# Patient Record
Sex: Female | Born: 1975
Health system: Southern US, Community
[De-identification: ages and names within clinical notes are randomized; demographics above are authoritative.]

## PROBLEM LIST (undated history)

## (undated) DIAGNOSIS — F419 Anxiety disorder, unspecified: Secondary | ICD-10-CM

## (undated) DIAGNOSIS — I341 Nonrheumatic mitral (valve) prolapse: Secondary | ICD-10-CM

## (undated) DIAGNOSIS — N840 Polyp of corpus uteri: Secondary | ICD-10-CM

## (undated) DIAGNOSIS — F411 Generalized anxiety disorder: Secondary | ICD-10-CM

## (undated) DIAGNOSIS — F410 Panic disorder [episodic paroxysmal anxiety] without agoraphobia: Secondary | ICD-10-CM

## (undated) DIAGNOSIS — Z8659 Personal history of other mental and behavioral disorders: Secondary | ICD-10-CM

## (undated) DIAGNOSIS — D509 Iron deficiency anemia, unspecified: Secondary | ICD-10-CM

## (undated) DIAGNOSIS — D573 Sickle-cell trait: Secondary | ICD-10-CM

## (undated) DIAGNOSIS — N920 Excessive and frequent menstruation with regular cycle: Secondary | ICD-10-CM

## (undated) DIAGNOSIS — Z973 Presence of spectacles and contact lenses: Secondary | ICD-10-CM

## (undated) HISTORY — PX: TUBAL LIGATION: SHX77

---

## 1998-02-21 ENCOUNTER — Ambulatory Visit (HOSPITAL_COMMUNITY): Admission: RE | Admit: 1998-02-21 | Discharge: 1998-02-21 | Payer: Self-pay | Admitting: *Deleted

## 1998-02-21 ENCOUNTER — Inpatient Hospital Stay (HOSPITAL_COMMUNITY): Admission: AD | Admit: 1998-02-21 | Discharge: 1998-02-21 | Payer: Self-pay | Admitting: *Deleted

## 1998-03-31 ENCOUNTER — Inpatient Hospital Stay (HOSPITAL_COMMUNITY): Admission: AD | Admit: 1998-03-31 | Discharge: 1998-03-31 | Payer: Self-pay | Admitting: Obstetrics

## 1998-04-02 ENCOUNTER — Other Ambulatory Visit: Admission: RE | Admit: 1998-04-02 | Discharge: 1998-04-02 | Payer: Self-pay | Admitting: Obstetrics

## 1998-04-23 ENCOUNTER — Inpatient Hospital Stay (HOSPITAL_COMMUNITY): Admission: AD | Admit: 1998-04-23 | Discharge: 1998-04-23 | Payer: Self-pay | Admitting: *Deleted

## 1998-05-14 ENCOUNTER — Ambulatory Visit (HOSPITAL_COMMUNITY): Admission: RE | Admit: 1998-05-14 | Discharge: 1998-05-14 | Payer: Self-pay | Admitting: Obstetrics

## 1998-06-24 ENCOUNTER — Observation Stay (HOSPITAL_COMMUNITY): Admission: AD | Admit: 1998-06-24 | Discharge: 1998-06-24 | Payer: Self-pay | Admitting: Obstetrics

## 1998-06-28 ENCOUNTER — Inpatient Hospital Stay (HOSPITAL_COMMUNITY): Admission: AD | Admit: 1998-06-28 | Discharge: 1998-07-01 | Payer: Self-pay | Admitting: Obstetrics

## 1998-07-26 ENCOUNTER — Emergency Department (HOSPITAL_COMMUNITY): Admission: EM | Admit: 1998-07-26 | Discharge: 1998-07-27 | Payer: Self-pay | Admitting: Emergency Medicine

## 1998-09-22 ENCOUNTER — Emergency Department (HOSPITAL_COMMUNITY): Admission: EM | Admit: 1998-09-22 | Discharge: 1998-09-22 | Payer: Self-pay

## 1998-10-16 ENCOUNTER — Emergency Department (HOSPITAL_COMMUNITY): Admission: EM | Admit: 1998-10-16 | Discharge: 1998-10-16 | Payer: Self-pay | Admitting: Emergency Medicine

## 1998-11-15 HISTORY — PX: TUBAL LIGATION: SHX77

## 1999-04-11 ENCOUNTER — Inpatient Hospital Stay (HOSPITAL_COMMUNITY): Admission: AD | Admit: 1999-04-11 | Discharge: 1999-04-11 | Payer: Self-pay | Admitting: Obstetrics

## 1999-04-28 ENCOUNTER — Inpatient Hospital Stay (HOSPITAL_COMMUNITY): Admission: AD | Admit: 1999-04-28 | Discharge: 1999-04-28 | Payer: Self-pay | Admitting: Obstetrics

## 1999-05-25 ENCOUNTER — Inpatient Hospital Stay (HOSPITAL_COMMUNITY): Admission: AD | Admit: 1999-05-25 | Discharge: 1999-05-25 | Payer: Self-pay | Admitting: Obstetrics

## 1999-07-06 ENCOUNTER — Other Ambulatory Visit: Admission: RE | Admit: 1999-07-06 | Discharge: 1999-07-06 | Payer: Self-pay | Admitting: Obstetrics

## 1999-10-23 ENCOUNTER — Inpatient Hospital Stay (HOSPITAL_COMMUNITY): Admission: AD | Admit: 1999-10-23 | Discharge: 1999-10-25 | Payer: Self-pay | Admitting: Obstetrics

## 1999-10-23 ENCOUNTER — Encounter (INDEPENDENT_AMBULATORY_CARE_PROVIDER_SITE_OTHER): Payer: Self-pay | Admitting: Specialist

## 2000-07-17 ENCOUNTER — Emergency Department (HOSPITAL_COMMUNITY): Admission: EM | Admit: 2000-07-17 | Discharge: 2000-07-17 | Payer: Self-pay | Admitting: Emergency Medicine

## 2000-07-17 ENCOUNTER — Encounter: Payer: Self-pay | Admitting: Emergency Medicine

## 2000-12-03 ENCOUNTER — Emergency Department (HOSPITAL_COMMUNITY): Admission: EM | Admit: 2000-12-03 | Discharge: 2000-12-03 | Payer: Self-pay | Admitting: Emergency Medicine

## 2001-10-17 ENCOUNTER — Emergency Department (HOSPITAL_COMMUNITY): Admission: EM | Admit: 2001-10-17 | Discharge: 2001-10-17 | Payer: Self-pay | Admitting: Emergency Medicine

## 2002-03-18 ENCOUNTER — Emergency Department (HOSPITAL_COMMUNITY): Admission: EM | Admit: 2002-03-18 | Discharge: 2002-03-18 | Payer: Self-pay | Admitting: Emergency Medicine

## 2002-03-30 ENCOUNTER — Emergency Department (HOSPITAL_COMMUNITY): Admission: EM | Admit: 2002-03-30 | Discharge: 2002-03-30 | Payer: Self-pay | Admitting: Emergency Medicine

## 2002-09-05 ENCOUNTER — Emergency Department (HOSPITAL_COMMUNITY): Admission: EM | Admit: 2002-09-05 | Discharge: 2002-09-05 | Payer: Self-pay | Admitting: Emergency Medicine

## 2002-09-05 ENCOUNTER — Encounter: Payer: Self-pay | Admitting: Emergency Medicine

## 2003-07-30 ENCOUNTER — Inpatient Hospital Stay (HOSPITAL_COMMUNITY): Admission: AD | Admit: 2003-07-30 | Discharge: 2003-07-30 | Payer: Self-pay | Admitting: Obstetrics

## 2003-08-16 ENCOUNTER — Inpatient Hospital Stay (HOSPITAL_COMMUNITY): Admission: AD | Admit: 2003-08-16 | Discharge: 2003-08-16 | Payer: Self-pay | Admitting: Obstetrics

## 2003-10-29 ENCOUNTER — Emergency Department (HOSPITAL_COMMUNITY): Admission: EM | Admit: 2003-10-29 | Discharge: 2003-10-29 | Payer: Self-pay | Admitting: Emergency Medicine

## 2004-05-02 ENCOUNTER — Inpatient Hospital Stay (HOSPITAL_COMMUNITY): Admission: AD | Admit: 2004-05-02 | Discharge: 2004-05-03 | Payer: Self-pay | Admitting: Obstetrics

## 2005-03-31 ENCOUNTER — Emergency Department (HOSPITAL_COMMUNITY): Admission: EM | Admit: 2005-03-31 | Discharge: 2005-03-31 | Payer: Self-pay | Admitting: Emergency Medicine

## 2005-12-27 ENCOUNTER — Inpatient Hospital Stay (HOSPITAL_COMMUNITY): Admission: AD | Admit: 2005-12-27 | Discharge: 2005-12-27 | Payer: Self-pay | Admitting: *Deleted

## 2006-01-17 ENCOUNTER — Inpatient Hospital Stay (HOSPITAL_COMMUNITY): Admission: AD | Admit: 2006-01-17 | Discharge: 2006-01-17 | Payer: Self-pay | Admitting: Obstetrics

## 2006-05-06 ENCOUNTER — Emergency Department (HOSPITAL_COMMUNITY): Admission: EM | Admit: 2006-05-06 | Discharge: 2006-05-06 | Payer: Self-pay | Admitting: Emergency Medicine

## 2006-07-06 ENCOUNTER — Emergency Department (HOSPITAL_COMMUNITY): Admission: EM | Admit: 2006-07-06 | Discharge: 2006-07-06 | Payer: Self-pay | Admitting: Family Medicine

## 2006-07-24 ENCOUNTER — Emergency Department (HOSPITAL_COMMUNITY): Admission: EM | Admit: 2006-07-24 | Discharge: 2006-07-24 | Payer: Self-pay | Admitting: Family Medicine

## 2006-07-27 ENCOUNTER — Emergency Department (HOSPITAL_COMMUNITY): Admission: EM | Admit: 2006-07-27 | Discharge: 2006-07-27 | Payer: Self-pay | Admitting: Family Medicine

## 2006-12-13 ENCOUNTER — Emergency Department (HOSPITAL_COMMUNITY): Admission: EM | Admit: 2006-12-13 | Discharge: 2006-12-14 | Payer: Self-pay | Admitting: Emergency Medicine

## 2006-12-28 ENCOUNTER — Emergency Department (HOSPITAL_COMMUNITY): Admission: EM | Admit: 2006-12-28 | Discharge: 2006-12-28 | Payer: Self-pay | Admitting: Emergency Medicine

## 2007-12-03 ENCOUNTER — Emergency Department (HOSPITAL_COMMUNITY): Admission: EM | Admit: 2007-12-03 | Discharge: 2007-12-03 | Payer: Self-pay | Admitting: Emergency Medicine

## 2008-06-11 ENCOUNTER — Inpatient Hospital Stay (HOSPITAL_COMMUNITY): Admission: AD | Admit: 2008-06-11 | Discharge: 2008-06-12 | Payer: Self-pay | Admitting: Obstetrics

## 2008-08-22 ENCOUNTER — Emergency Department (HOSPITAL_COMMUNITY): Admission: EM | Admit: 2008-08-22 | Discharge: 2008-08-22 | Payer: Self-pay | Admitting: Family Medicine

## 2008-11-15 HISTORY — PX: ESOPHAGOGASTRODUODENOSCOPY: SHX1529

## 2008-12-05 ENCOUNTER — Emergency Department (HOSPITAL_COMMUNITY): Admission: EM | Admit: 2008-12-05 | Discharge: 2008-12-06 | Payer: Self-pay | Admitting: Emergency Medicine

## 2008-12-13 ENCOUNTER — Emergency Department (HOSPITAL_COMMUNITY): Admission: EM | Admit: 2008-12-13 | Discharge: 2008-12-13 | Payer: Self-pay | Admitting: Family Medicine

## 2009-01-01 ENCOUNTER — Emergency Department (HOSPITAL_COMMUNITY): Admission: EM | Admit: 2009-01-01 | Discharge: 2009-01-02 | Payer: Self-pay | Admitting: Emergency Medicine

## 2009-01-16 ENCOUNTER — Emergency Department (HOSPITAL_COMMUNITY): Admission: EM | Admit: 2009-01-16 | Discharge: 2009-01-16 | Payer: Self-pay | Admitting: Emergency Medicine

## 2009-01-27 ENCOUNTER — Observation Stay (HOSPITAL_COMMUNITY): Admission: AD | Admit: 2009-01-27 | Discharge: 2009-01-29 | Payer: Self-pay | Admitting: Cardiology

## 2009-01-28 ENCOUNTER — Encounter (INDEPENDENT_AMBULATORY_CARE_PROVIDER_SITE_OTHER): Payer: Self-pay | Admitting: Cardiology

## 2009-02-11 ENCOUNTER — Emergency Department (HOSPITAL_COMMUNITY): Admission: EM | Admit: 2009-02-11 | Discharge: 2009-02-12 | Payer: Self-pay | Admitting: Emergency Medicine

## 2009-05-29 IMAGING — CT CT HEAD W/O CM
1 series · 16 of 30 positions shown, 20 images · non-contrast
Comparison: 12/28/2006

CLINICAL DATA: Right-sided head pain.  Headache.

CT HEAD WITHOUT CONTRAST
TECHNIQUE: Contiguous axial images were obtained from the base of
the skull through the vertex without contrast.

[Series 2: head_seq 4.5 h37s st · axial · 0.43mm/px · z∈[-105,+39]mm · 16 of 36 slices shown, 20 images]
[im 2/36  brain]
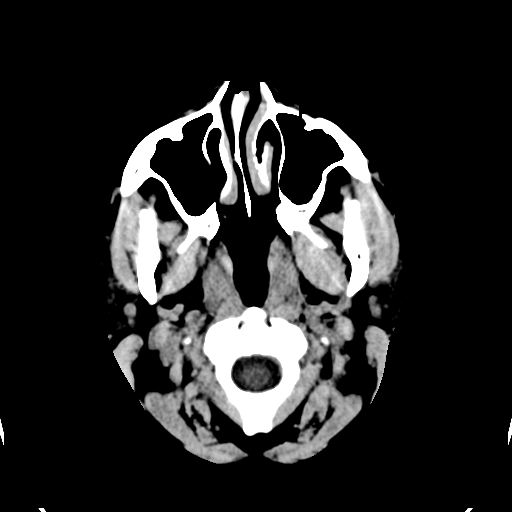
[im 2/36  bone]
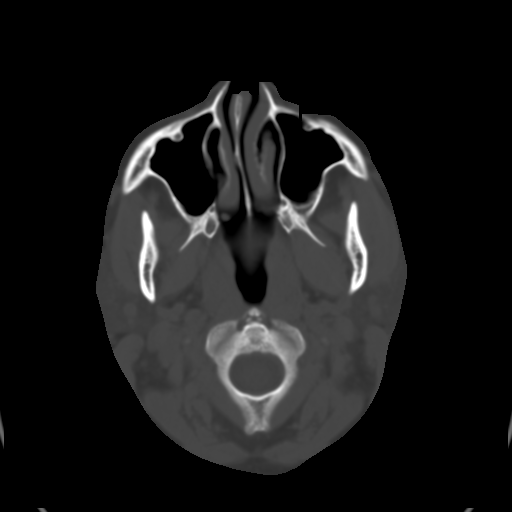
[im 4/36  brain]
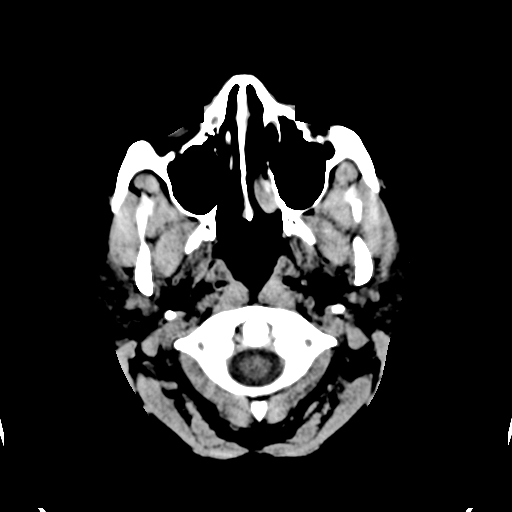
[im 7/36  brain]
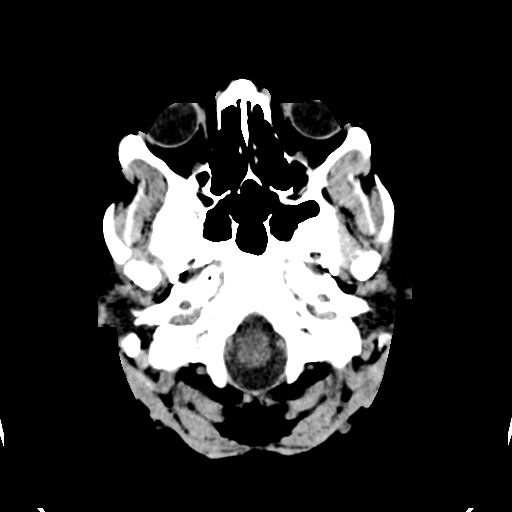
[im 9/36  brain]
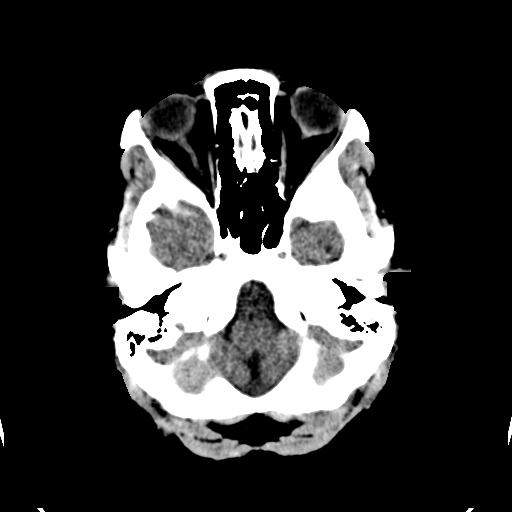
[im 10/36  brain]
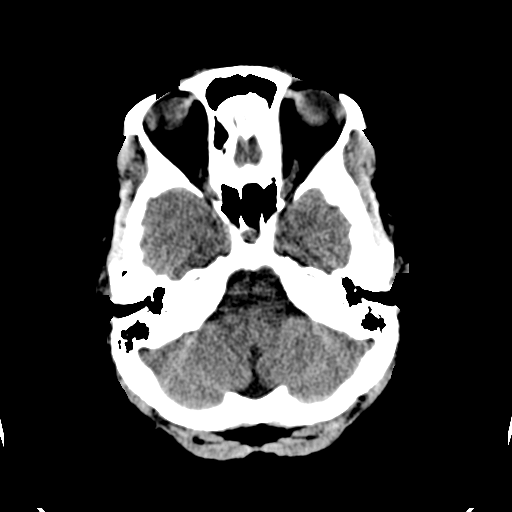
[im 10/36  bone]
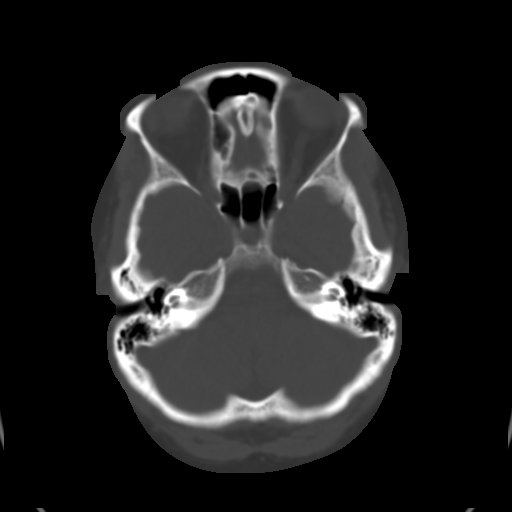
[im 13/36  brain]
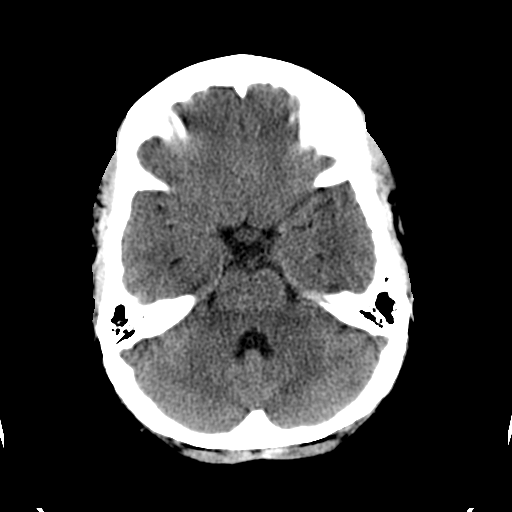
[im 15/36  brain]
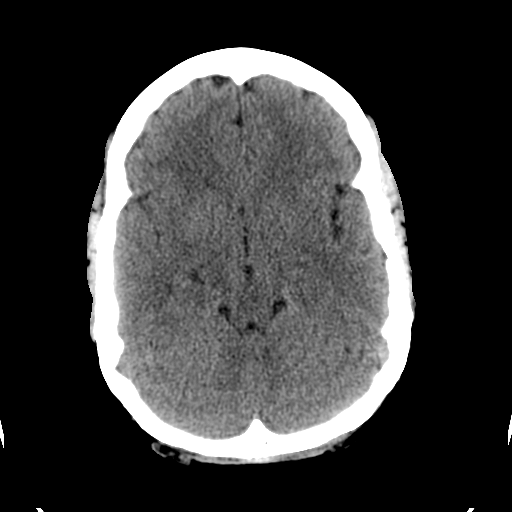
[im 17/36  brain]
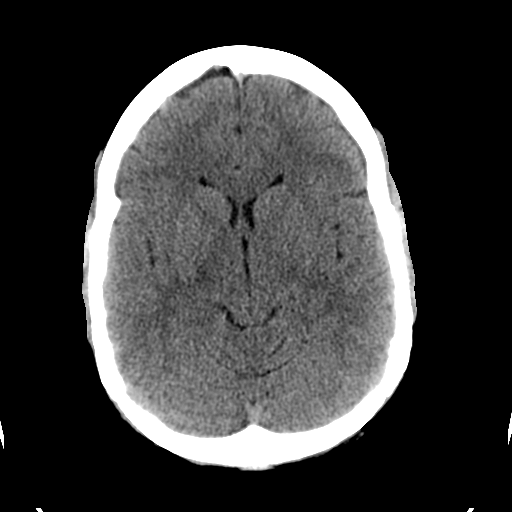
[im 19/36  brain]
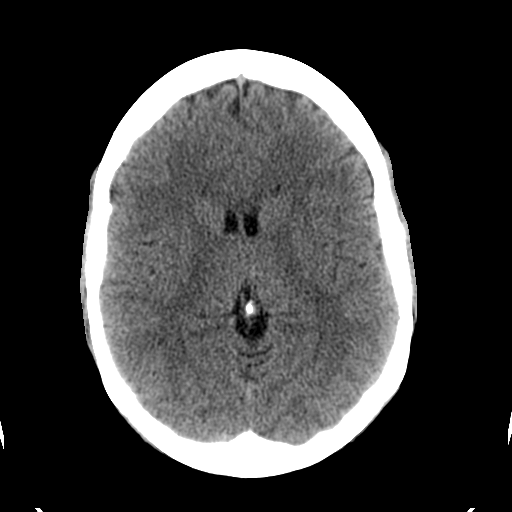
[im 19/36  bone]
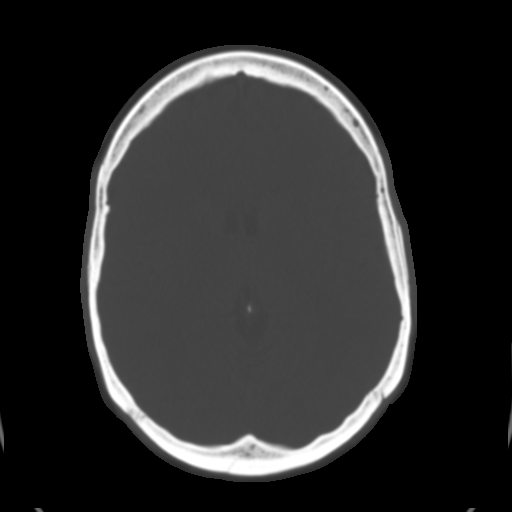
[im 21/36  brain]
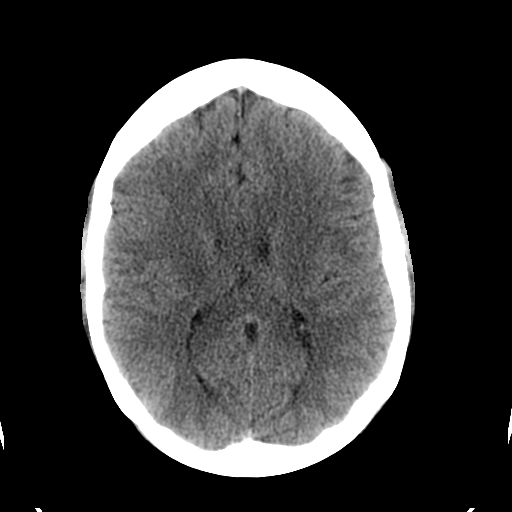
[im 23/36  brain]
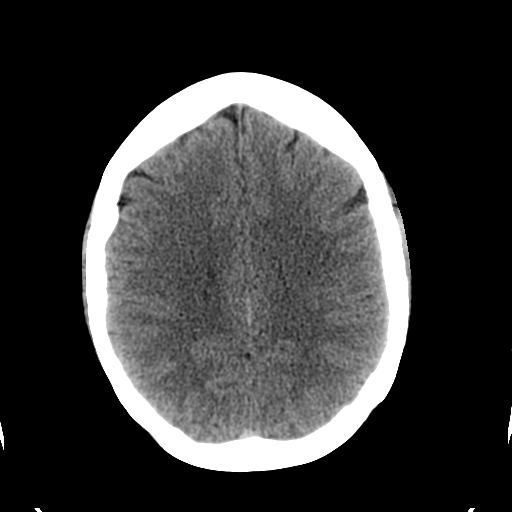
[im 26/36  brain]
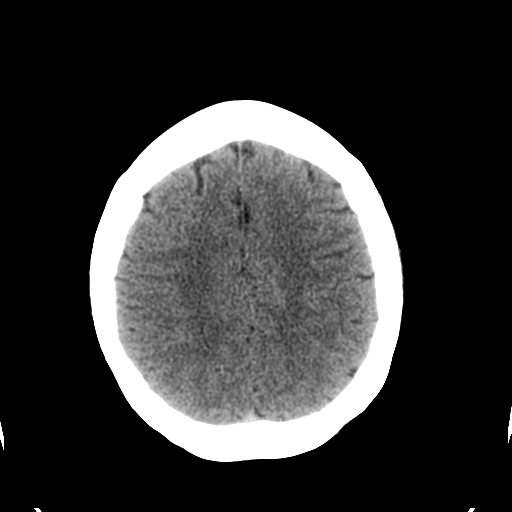
[im 27/36  brain]
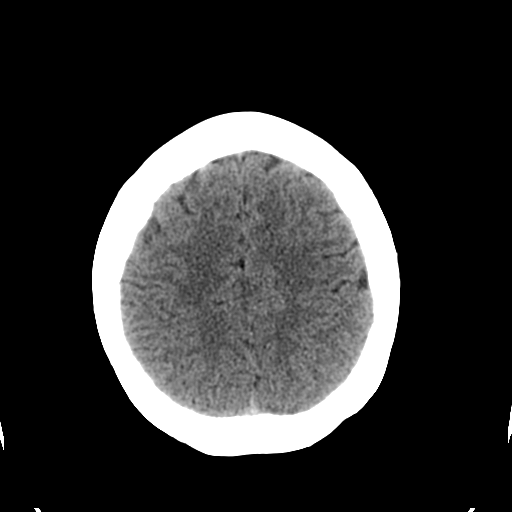
[im 27/36  bone]
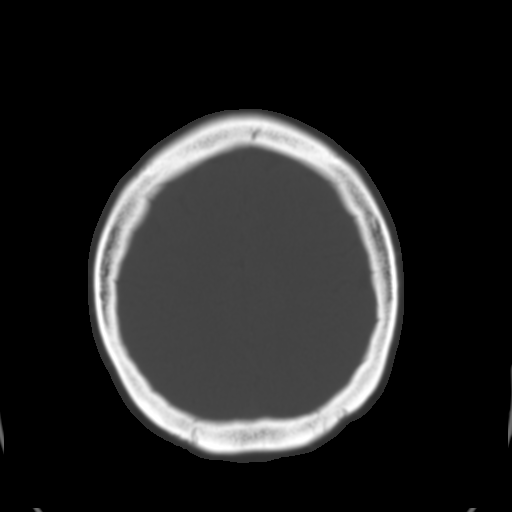
[im 29/36  brain]
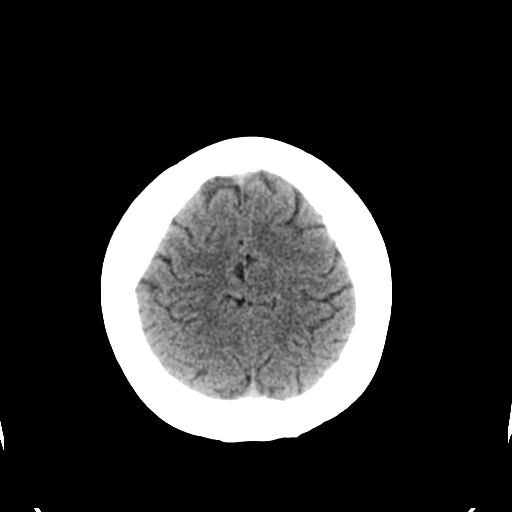
[im 32/36  brain]
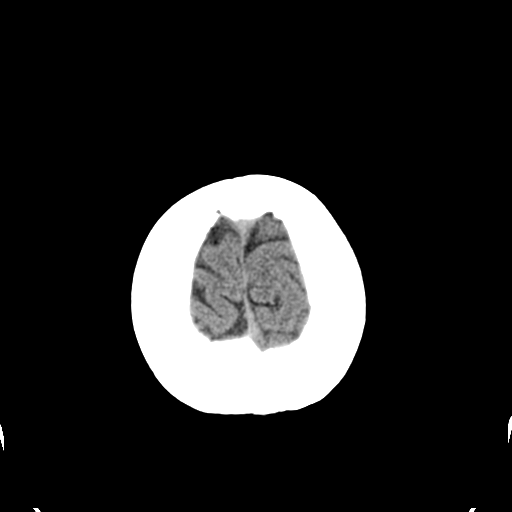
[im 34/36  brain]
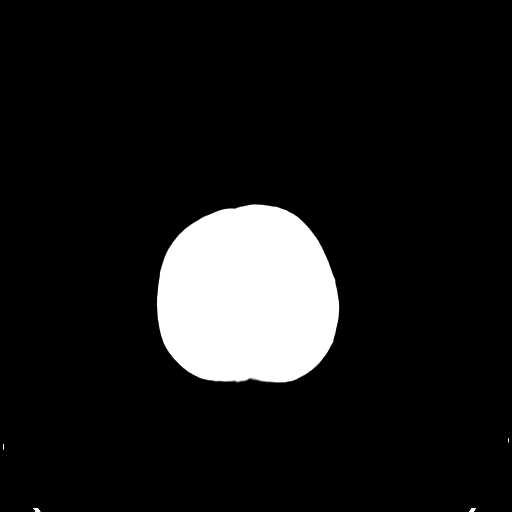

[16 of 30 positions shown; findings below may reference images not displayed]

FINDINGS: No mass lesion, mass effect, midline shift,
hydrocephalus, hemorrhage.  No territorial ischemia or acute
infarction.  Paranasal sinuses appear clear.  Mastoid air cells
clear.
IMPRESSION: No acute intracranial abnormality.

## 2010-10-28 ENCOUNTER — Emergency Department (HOSPITAL_COMMUNITY)
Admission: EM | Admit: 2010-10-28 | Discharge: 2010-10-28 | Payer: Self-pay | Source: Home / Self Care | Admitting: Emergency Medicine

## 2010-11-18 ENCOUNTER — Emergency Department (HOSPITAL_COMMUNITY)
Admission: EM | Admit: 2010-11-18 | Discharge: 2010-11-18 | Payer: Self-pay | Source: Home / Self Care | Admitting: Emergency Medicine

## 2010-11-28 ENCOUNTER — Emergency Department (HOSPITAL_COMMUNITY)
Admission: EM | Admit: 2010-11-28 | Discharge: 2010-11-29 | Payer: Self-pay | Source: Home / Self Care | Admitting: Emergency Medicine

## 2011-02-25 LAB — URINALYSIS, ROUTINE W REFLEX MICROSCOPIC
Bilirubin Urine: NEGATIVE
Glucose, UA: NEGATIVE mg/dL
Ketones, ur: NEGATIVE mg/dL
Protein, ur: NEGATIVE mg/dL

## 2011-02-25 LAB — COMPREHENSIVE METABOLIC PANEL
Alkaline Phosphatase: 62 U/L (ref 39–117)
BUN: 6 mg/dL (ref 6–23)
Chloride: 106 mEq/L (ref 96–112)
Glucose, Bld: 92 mg/dL (ref 70–99)
Potassium: 3.7 mEq/L (ref 3.5–5.1)
Total Bilirubin: 0.5 mg/dL (ref 0.3–1.2)

## 2011-02-25 LAB — CBC
HCT: 37.8 % (ref 36.0–46.0)
Hemoglobin: 12.9 g/dL (ref 12.0–15.0)
MCHC: 33.9 g/dL (ref 30.0–36.0)
MCV: 86.1 fL (ref 78.0–100.0)
Platelets: 216 10*3/uL (ref 150–400)
RDW: 14.2 % (ref 11.5–15.5)
WBC: 4.3 10*3/uL (ref 4.0–10.5)
WBC: 4.7 10*3/uL (ref 4.0–10.5)

## 2011-02-25 LAB — TSH
TSH: 0.568 u[IU]/mL (ref 0.350–4.500)
TSH: 0.308 u[IU]/mL — ABNORMAL LOW (ref 0.350–4.500)

## 2011-02-25 LAB — BASIC METABOLIC PANEL
BUN: 7 mg/dL (ref 6–23)
CO2: 24 mEq/L (ref 19–32)
Chloride: 106 mEq/L (ref 96–112)
Creatinine, Ser: 0.73 mg/dL (ref 0.4–1.2)
Glucose, Bld: 94 mg/dL (ref 70–99)
Glucose, Bld: 103 mg/dL — ABNORMAL HIGH (ref 70–99)
Potassium: 3.7 mEq/L (ref 3.5–5.1)
Sodium: 139 mEq/L (ref 135–145)

## 2011-02-25 LAB — DIFFERENTIAL
Basophils Absolute: 0 10*3/uL (ref 0.0–0.1)
Eosinophils Absolute: 0 10*3/uL (ref 0.0–0.7)
Lymphocytes Relative: 30 % (ref 12–46)
Neutro Abs: 2.7 10*3/uL (ref 1.7–7.7)
Neutrophils Relative %: 57 % (ref 43–77)

## 2011-02-25 LAB — POCT PREGNANCY, URINE: Preg Test, Ur: NEGATIVE

## 2011-03-01 LAB — POCT I-STAT, CHEM 8
Calcium, Ion: 1.17 mmol/L (ref 1.12–1.32)
Creatinine, Ser: 0.7 mg/dL (ref 0.4–1.2)
Glucose, Bld: 91 mg/dL (ref 70–99)
HCT: 41 % (ref 36.0–46.0)
Hemoglobin: 13.9 g/dL (ref 12.0–15.0)
TCO2: 27 mmol/L (ref 0–100)

## 2011-03-02 LAB — COMPREHENSIVE METABOLIC PANEL
ALT: 13 U/L (ref 0–35)
AST: 13 U/L (ref 0–37)
CO2: 27 mEq/L (ref 19–32)
Calcium: 8.9 mg/dL (ref 8.4–10.5)
Chloride: 102 mEq/L (ref 96–112)
Creatinine, Ser: 0.67 mg/dL (ref 0.4–1.2)
GFR calc non Af Amer: >60 mL/min (ref >60)
Glucose, Bld: 86 mg/dL (ref 70–99)
Total Bilirubin: 0.6 mg/dL (ref 0.3–1.2)

## 2011-03-02 LAB — POCT CARDIAC MARKERS
CKMB, poc: <1 ng/mL — ABNORMAL LOW (ref 1.0–8.0)
Troponin i, poc: <0.05 ng/mL (ref 0.00–0.09)

## 2011-03-02 LAB — DIFFERENTIAL
Eosinophils Absolute: 0.1 10*3/uL (ref 0.0–0.7)
Eosinophils Relative: 1 % (ref 0–5)
Lymphocytes Relative: 49 % — ABNORMAL HIGH (ref 12–46)
Lymphs Abs: 2.7 10*3/uL (ref 0.7–4.0)
Neutrophils Relative %: 40 % — ABNORMAL LOW (ref 43–77)

## 2011-03-02 LAB — CBC
Hemoglobin: 12.6 g/dL (ref 12.0–15.0)
MCHC: 33.5 g/dL (ref 30.0–36.0)
MCV: 86.6 fL (ref 78.0–100.0)
RBC: 4.35 MIL/uL (ref 3.87–5.11)
WBC: 5.5 10*3/uL (ref 4.0–10.5)

## 2011-04-02 NOTE — Discharge Summary (Signed)
[facility]  Patient:    KASHAYLA UNGERER                          MRN: [id number] Adm. Date:  [id number] Disc. Date: 10/25/99 Attending:  Venita Sheffield                           Discharge Summary  HISTORY:                      The patient is a 35 year old gravida 4, para 3-0-0-3, who was admitted at term in labor.  She progressed and had a normal vaginal delivery of a female, Apgars 9 and 9.  HOSPITAL COURSE:              Post delivery hemoglobin was 11.8.  She desired permanent sterilization.  On December 9 she underwent postpartum tubal ligation. She was discharged home on December 10, ambulatory and on a regular diet.  DISCHARGE MEDICATIONS:        1. Tylenol No. 3 one q.4h. p.r.n. pain.                               2. Ferrous sulfate 325 mg p.o. q.d.  FOLLOWUP:                     To see me in six weeks.  DISCHARGE DIAGNOSIS:          Status post normal vaginal delivery at term and postpartum tubal ligation. DD:  10/25/99 TD:  10/25/99 Job: [id number]

## 2011-07-17 ENCOUNTER — Emergency Department (HOSPITAL_COMMUNITY)
Admission: EM | Admit: 2011-07-17 | Discharge: 2011-07-18 | Disposition: A | Discharge status: Home / Self Care | Payer: Self-pay | Attending: Emergency Medicine | Admitting: Emergency Medicine

## 2011-07-17 DIAGNOSIS — R079 Chest pain, unspecified: Secondary | ICD-10-CM | POA: Insufficient documentation

## 2011-07-17 DIAGNOSIS — R0989 Other specified symptoms and signs involving the circulatory and respiratory systems: Secondary | ICD-10-CM | POA: Insufficient documentation

## 2011-07-17 DIAGNOSIS — N390 Urinary tract infection, site not specified: Secondary | ICD-10-CM | POA: Insufficient documentation

## 2011-07-17 DIAGNOSIS — R002 Palpitations: Secondary | ICD-10-CM | POA: Insufficient documentation

## 2011-07-17 DIAGNOSIS — R0609 Other forms of dyspnea: Secondary | ICD-10-CM | POA: Insufficient documentation

## 2011-07-17 DIAGNOSIS — K219 Gastro-esophageal reflux disease without esophagitis: Secondary | ICD-10-CM | POA: Insufficient documentation

## 2011-07-18 ENCOUNTER — Emergency Department (HOSPITAL_COMMUNITY): Payer: Self-pay

## 2011-07-18 LAB — POCT I-STAT TROPONIN I: Troponin i, poc: 0 ng/mL (ref 0.00–0.08)

## 2011-07-18 LAB — HEPATIC FUNCTION PANEL
Albumin: 3.8 g/dL (ref 3.5–5.2)
Indirect Bilirubin: 0.1 mg/dL — ABNORMAL LOW (ref 0.3–0.9)
Total Bilirubin: 0.2 mg/dL — ABNORMAL LOW (ref 0.3–1.2)
Total Protein: 7.6 g/dL (ref 6.0–8.3)

## 2011-07-18 LAB — DIFFERENTIAL
Basophils Relative: 0 % (ref 0–1)
Eosinophils Absolute: 0 10*3/uL (ref 0.0–0.7)
Eosinophils Relative: 1 % (ref 0–5)
Neutrophils Relative %: 43 % (ref 43–77)

## 2011-07-18 LAB — URINALYSIS, ROUTINE W REFLEX MICROSCOPIC
Bilirubin Urine: NEGATIVE
Hgb urine dipstick: NEGATIVE
Nitrite: NEGATIVE
pH: 6 (ref 5.0–8.0)

## 2011-07-18 LAB — POCT I-STAT, CHEM 8
BUN: 6 mg/dL (ref 6–23)
Calcium, Ion: 1.25 mmol/L (ref 1.12–1.32)
Chloride: 103 meq/L (ref 96–112)
Creatinine, Ser: 0.7 mg/dL (ref 0.50–1.10)
Glucose, Bld: 97 mg/dL (ref 70–99)
HCT: 39 % (ref 36.0–46.0)
Hemoglobin: 13.3 g/dL (ref 12.0–15.0)
Potassium: 3.7 meq/L (ref 3.5–5.1)
Sodium: 143 meq/L (ref 135–145)
TCO2: 28 mmol/L (ref 0–100)

## 2011-07-18 LAB — URINE MICROSCOPIC-ADD ON

## 2011-07-18 LAB — CBC
MCH: 26.5 pg (ref 26.0–34.0)
Platelets: 307 10*3/uL (ref 150–400)
RBC: 4.46 MIL/uL (ref 3.87–5.11)
RDW: 15 % (ref 11.5–15.5)
WBC: 4.7 10*3/uL (ref 4.0–10.5)

## 2011-07-18 LAB — POCT PREGNANCY, URINE: Preg Test, Ur: NEGATIVE

## 2011-07-18 LAB — D-DIMER, QUANTITATIVE: D-Dimer, Quant: 0.44 ug/mL-FEU (ref 0.00–0.48)

## 2011-07-18 LAB — CK TOTAL AND CKMB (NOT AT ARMC): Relative Index: 0.9 (ref 0.0–2.5)

## 2011-08-13 LAB — WET PREP, GENITAL
Trich, Wet Prep: NONE SEEN
Yeast Wet Prep HPF POC: NONE SEEN

## 2011-08-26 ENCOUNTER — Encounter (HOSPITAL_COMMUNITY): Payer: Self-pay | Admitting: *Deleted

## 2011-08-26 ENCOUNTER — Inpatient Hospital Stay (HOSPITAL_COMMUNITY)
Admission: AD | Admit: 2011-08-26 | Discharge: 2011-08-26 | Disposition: A | Discharge status: Home / Self Care | Payer: Medicaid Other | Source: Ambulatory Visit | Attending: Obstetrics | Admitting: Obstetrics

## 2011-08-26 DIAGNOSIS — L293 Anogenital pruritus, unspecified: Secondary | ICD-10-CM | POA: Insufficient documentation

## 2011-08-26 DIAGNOSIS — A5901 Trichomonal vulvovaginitis: Secondary | ICD-10-CM | POA: Insufficient documentation

## 2011-08-26 LAB — WET PREP, GENITAL

## 2011-08-26 LAB — URINALYSIS, ROUTINE W REFLEX MICROSCOPIC
Ketones, ur: 15 mg/dL — AB
Leukocytes, UA: NEGATIVE
Nitrite: NEGATIVE
Protein, ur: NEGATIVE mg/dL
Urobilinogen, UA: 0.2 mg/dL (ref 0.0–1.0)

## 2011-08-26 LAB — POCT PREGNANCY, URINE: Preg Test, Ur: NEGATIVE

## 2011-08-26 MED ORDER — METRONIDAZOLE 500 MG PO TABS
2000.0000 mg | ORAL_TABLET | Freq: Once | ORAL | Status: AC
  Administered 2011-08-26: 2000 mg via ORAL
  Filled 2011-08-26: qty 4

## 2011-08-26 NOTE — Progress Notes (Signed)
PT SAYS   THAT THOUGHT HAD YEAST INFECTION  ON 08-05-2011- GOT MONISTAT- 3 DAY,  THEN AFTER CYCLE STARTED-  WENT TO [facility] FOR HEART PALPATATIONS- DR  SOLOMON-  NEEDS TO FIND ANOTHER DR.   SO  AT [facility] TOLD HER UTI- GAVE MACROBID- MISSED 4 PILLS- LEFT MEDS AT HOME WHEN OUT OF TOWN-  NOW FEELS LIKE UTI BACK AND HAS VAG ITCHING WITH FISHY ODOR .    NO BIRTH CONTROL-- BTL IN 2001

## 2011-08-26 NOTE — ED Provider Notes (Signed)
History     Chief Complaint  Patient presents with  . Vaginal Itching   HPI C/O fishy smelling vaginal discharge and itching. Was treated for a UTI last month with Macrobid, but did not finish full course of medication.   OB History    Grav Para Term Preterm Abortions TAB SAB Ect Mult Living   4 4 4  0 0 0 0 0 0 4      No past medical history on file.  No past surgical history on file.  No family history on file.  History  Substance Use Topics  . Smoking status: Not on file  . Smokeless tobacco: Not on file  . Alcohol Use:     Allergies:  Allergies  Allergen Reactions  . Ciprofloxacin Itching    Prescriptions prior to admission  Medication Sig Dispense Refill  . BEE POLLEN PO Take 1 tablet by mouth daily.          Review of Systems  Constitutional: Negative.   Respiratory: Negative.   Cardiovascular: Negative.   Gastrointestinal: Negative for nausea, vomiting, abdominal pain, diarrhea and constipation.  Genitourinary: Negative for dysuria, urgency, frequency, hematuria and flank pain.       Negative for vaginal bleeding, Positive for vaginal discharge  Musculoskeletal: Negative.   Neurological: Negative.   Psychiatric/Behavioral: Negative.    Physical Exam   Blood pressure 126/79, pulse 88, temperature 98.6 F (37 C), height 5' (1.524 m), weight 93.554 kg (206 lb 4 oz), last menstrual period 08/12/2011.  Physical Exam  Constitutional: She is oriented to person, place, and time. She appears well-developed and well-nourished. No distress.  HENT:  Head: Normocephalic and atraumatic.  Cardiovascular: Normal rate, regular rhythm and normal heart sounds.   Respiratory: Effort normal and breath sounds normal. No respiratory distress.  GI: Soft. Bowel sounds are normal. She exhibits no distension and no mass. There is no tenderness. There is no rebound and no guarding.  Genitourinary: There is no rash or lesion on the right labia. There is no rash or lesion on  the left labia. Uterus is not deviated, not enlarged, not fixed and not tender. Cervix exhibits discharge (egg white mucous). Cervix exhibits no motion tenderness and no friability. Right adnexum displays no mass, no tenderness and no fullness. Left adnexum displays no mass, no tenderness and no fullness. No erythema, tenderness or bleeding around the vagina. Vaginal discharge (white) found.  Neurological: She is alert and oriented to person, place, and time.  Skin: Skin is warm and dry.  Psychiatric: She has a normal mood and affect.    MAU Course  Procedures  Results for orders placed during the hospital encounter of 08/26/11 (from the past 24 hour(s))  URINALYSIS, ROUTINE W REFLEX MICROSCOPIC     Status: Abnormal   Collection Time   08/26/11  8:42 PM      Component Value Range   Color, Urine YELLOW  YELLOW    Appearance CLEAR  CLEAR    Specific Gravity, Urine 1.025  1.005 - 1.030    pH 5.5  5.0 - 8.0    Glucose, UA NEGATIVE  NEGATIVE (mg/dL)   Hgb urine dipstick NEGATIVE  NEGATIVE    Bilirubin Urine NEGATIVE  NEGATIVE    Ketones, ur 15 (*) NEGATIVE (mg/dL)   Protein, ur NEGATIVE  NEGATIVE (mg/dL)   Urobilinogen, UA 0.2  0.0 - 1.0 (mg/dL)   Nitrite NEGATIVE  NEGATIVE    Leukocytes, UA NEGATIVE  NEGATIVE   POCT PREGNANCY, URINE  Status: Normal   Collection Time   08/26/11 10:11 PM      Component Value Range   Preg Test, Ur NEGATIVE    WET PREP, GENITAL     Status: Abnormal   Collection Time   08/26/11 10:27 PM      Component Value Range   Yeast, Wet Prep NONE SEEN  NONE SEEN    Trich, Wet Prep FEW (*) NONE SEEN    Clue Cells, Wet Prep FEW (*) NONE SEEN    WBC, Wet Prep HPF POC FEW (*) NONE SEEN      Assessment and Plan  Trichomoniasis - treated with Flagyl in MAU, partner needs treatment  Cyla Haluska 08/26/2011, 11:07 PM

## 2011-08-26 NOTE — Progress Notes (Signed)
Pt was seen in [place] [facility] ED Sept 3 for heart palpitations. Urine was sent and it showed UTI- pt treated with macrobid ( pt did not complete the medication) Pt states she is here in MAU for vaginal itching and vaginal rash.

## 2011-08-26 NOTE — ED Provider Notes (Signed)
Agree with above note.  Christina Mcglamery H. 08/26/2011 11:25 PM  

## 2012-04-10 ENCOUNTER — Encounter (HOSPITAL_COMMUNITY): Payer: Self-pay

## 2012-04-10 ENCOUNTER — Emergency Department (HOSPITAL_COMMUNITY)
Admission: EM | Admit: 2012-04-10 | Discharge: 2012-04-10 | Disposition: A | Discharge status: Home / Self Care | Payer: Self-pay | Attending: Emergency Medicine | Admitting: Emergency Medicine

## 2012-04-10 DIAGNOSIS — R42 Dizziness and giddiness: Secondary | ICD-10-CM | POA: Insufficient documentation

## 2012-04-10 DIAGNOSIS — R079 Chest pain, unspecified: Secondary | ICD-10-CM | POA: Insufficient documentation

## 2012-04-10 HISTORY — DX: Panic disorder (episodic paroxysmal anxiety): F41.0

## 2012-04-10 HISTORY — DX: Nonrheumatic mitral (valve) prolapse: I34.1

## 2012-04-10 HISTORY — DX: Anxiety disorder, unspecified: F41.9

## 2012-04-10 NOTE — ED Notes (Signed)
Pt presents with no acute distress. Pt reports HX of chest pain.   Called EMS today for presenting complaint denies transfer at that time.  Pt here for chest pain and dizziness- No neuro deficits. GCS 15 PEERL

## 2012-04-29 ENCOUNTER — Encounter (HOSPITAL_COMMUNITY): Payer: Self-pay

## 2012-04-29 ENCOUNTER — Inpatient Hospital Stay (HOSPITAL_COMMUNITY)
Admission: AD | Admit: 2012-04-29 | Discharge: 2012-04-29 | Disposition: A | Discharge status: Home / Self Care | Payer: 59 | Source: Ambulatory Visit | Attending: Obstetrics | Admitting: Obstetrics

## 2012-04-29 DIAGNOSIS — R300 Dysuria: Secondary | ICD-10-CM | POA: Insufficient documentation

## 2012-04-29 DIAGNOSIS — N390 Urinary tract infection, site not specified: Secondary | ICD-10-CM | POA: Insufficient documentation

## 2012-04-29 DIAGNOSIS — A499 Bacterial infection, unspecified: Secondary | ICD-10-CM | POA: Insufficient documentation

## 2012-04-29 DIAGNOSIS — R350 Frequency of micturition: Secondary | ICD-10-CM | POA: Insufficient documentation

## 2012-04-29 DIAGNOSIS — B9689 Other specified bacterial agents as the cause of diseases classified elsewhere: Secondary | ICD-10-CM | POA: Insufficient documentation

## 2012-04-29 DIAGNOSIS — N760 Acute vaginitis: Secondary | ICD-10-CM | POA: Insufficient documentation

## 2012-04-29 DIAGNOSIS — N949 Unspecified condition associated with female genital organs and menstrual cycle: Secondary | ICD-10-CM | POA: Insufficient documentation

## 2012-04-29 LAB — URINALYSIS, ROUTINE W REFLEX MICROSCOPIC
Ketones, ur: NEGATIVE mg/dL
Nitrite: NEGATIVE
Specific Gravity, Urine: 1.02 (ref 1.005–1.030)
pH: 6 (ref 5.0–8.0)

## 2012-04-29 LAB — WET PREP, GENITAL
Trich, Wet Prep: NONE SEEN
Yeast Wet Prep HPF POC: NONE SEEN

## 2012-04-29 LAB — URINE MICROSCOPIC-ADD ON

## 2012-04-29 MED ORDER — SEPTRA DS 800-160 MG PO TABS: 1.0000 | ORAL_TABLET | Freq: Two times a day (BID) | ORAL | Status: AC

## 2012-04-29 MED ORDER — METRONIDAZOLE 500 MG PO TABS: 500.0000 mg | ORAL_TABLET | Freq: Two times a day (BID) | ORAL | Status: DC

## 2012-04-29 MED ORDER — METRONIDAZOLE 500 MG PO TABS: 500.0000 mg | ORAL_TABLET | Freq: Two times a day (BID) | ORAL | Status: AC

## 2012-04-29 MED ORDER — SEPTRA DS 800-160 MG PO TABS: 1.0000 | ORAL_TABLET | Freq: Two times a day (BID) | ORAL | Status: DC

## 2012-04-29 NOTE — MAU Note (Signed)
Onset of dysuria for a few days, some cramping, some nausea, vaginal discharge, LMP 03/29/12

## 2012-04-29 NOTE — Discharge Instructions (Signed)
Bacterial Vaginosis Bacterial vaginosis (BV) is a vaginal infection where the normal balance of bacteria in the vagina is disrupted. The normal balance is then replaced by an overgrowth of certain bacteria. There are several different kinds of bacteria that can cause BV. BV is the most common vaginal infection in women of childbearing age. CAUSES   The cause of BV is not fully understood. BV develops when there is an increase or imbalance of harmful bacteria.   Some activities or behaviors can upset the normal balance of bacteria in the vagina and put women at increased risk including:   Having a new sex partner or multiple sex partners.   Douching.   Using an intrauterine device (IUD) for contraception.   It is not clear what role sexual activity plays in the development of BV. However, women that have never had sexual intercourse are rarely infected with BV.  Women do not get BV from toilet seats, bedding, swimming pools or from touching objects around them.  SYMPTOMS   Grey vaginal discharge.   A fish-like odor with discharge, especially after sexual intercourse.   Itching or burning of the vagina and vulva.   Burning or pain with urination.   Some women have no signs or symptoms at all.  DIAGNOSIS  Your caregiver must examine the vagina for signs of BV. Your caregiver will perform lab tests and look at the sample of vaginal fluid through a microscope. They will look for bacteria and abnormal cells (clue cells), a pH test higher than 4.5, and a positive amine test all associated with BV.  RISKS AND COMPLICATIONS   Pelvic inflammatory disease (PID).   Infections following gynecology surgery.   Developing HIV.   Developing herpes virus.  TREATMENT  Sometimes BV will clear up without treatment. However, all women with symptoms of BV should be treated to avoid complications, especially if gynecology surgery is planned. Female partners generally do not need to be treated. However,  BV may spread between female sex partners so treatment is helpful in preventing a recurrence of BV.   BV may be treated with antibiotics. The antibiotics come in either pill or vaginal cream forms. Either can be used with nonpregnant or pregnant women, but the recommended dosages differ. These antibiotics are not harmful to the baby.   BV can recur after treatment. If this happens, a second round of antibiotics will often be prescribed.   Treatment is important for pregnant women. If not treated, BV can cause a premature delivery, especially for a pregnant woman who had a premature birth in the past. All pregnant women who have symptoms of BV should be checked and treated.   For chronic reoccurrence of BV, treatment with a type of prescribed gel vaginally twice a week is helpful.  HOME CARE INSTRUCTIONS   Finish all medication as directed by your caregiver.   Do not have sex until treatment is completed.   Tell your sexual partner that you have a vaginal infection. They should see their caregiver and be treated if they have problems, such as a mild rash or itching.   Practice safe sex. Use condoms. Only have 1 sex partner.  PREVENTION  Basic prevention steps can help reduce the risk of upsetting the natural balance of bacteria in the vagina and developing BV:  Do not have sexual intercourse (be abstinent).   Do not douche.   Use all of the medicine prescribed for treatment of BV, even if the signs and symptoms go away.     Tell your sex partner if you have BV. That way, they can be treated, if needed, to prevent reoccurrence.  SEEK MEDICAL CARE IF:   Your symptoms are not improving after 3 days of treatment.   You have increased discharge, pain, or fever.  MAKE SURE YOU:   Understand these instructions.   Will watch your condition.   Will get help right away if you are not doing well or get worse.  FOR MORE INFORMATION  Division of STD Prevention (DSTDP), Centers for Disease  Control and Prevention: www.cdc.gov/std American Social Health Association (ASHA): www.ashastd.org  Document Released: 11/01/2005 Document Revised: 10/21/2011 Document Reviewed: 04/24/2009 ExitCare Patient Information 2012 [employer].Bacterial Vaginosis Bacterial vaginosis (BV) is a vaginal infection where the normal balance of bacteria in the vagina is disrupted. The normal balance is then replaced by an overgrowth of certain bacteria. There are several different kinds of bacteria that can cause BV. BV is the most common vaginal infection in women of childbearing age. CAUSES   The cause of BV is not fully understood. BV develops when there is an increase or imbalance of harmful bacteria.   Some activities or behaviors can upset the normal balance of bacteria in the vagina and put women at increased risk including:   Having a new sex partner or multiple sex partners.   Douching.   Using an intrauterine device (IUD) for contraception.   It is not clear what role sexual activity plays in the development of BV. However, women that have never had sexual intercourse are rarely infected with BV.  Women do not get BV from toilet seats, bedding, swimming pools or from touching objects around them.  SYMPTOMS   Grey vaginal discharge.   A fish-like odor with discharge, especially after sexual intercourse.   Itching or burning of the vagina and vulva.   Burning or pain with urination.   Some women have no signs or symptoms at all.  DIAGNOSIS  Your caregiver must examine the vagina for signs of BV. Your caregiver will perform lab tests and look at the sample of vaginal fluid through a microscope. They will look for bacteria and abnormal cells (clue cells), a pH test higher than 4.5, and a positive amine test all associated with BV.  RISKS AND COMPLICATIONS   Pelvic inflammatory disease (PID).   Infections following gynecology surgery.   Developing HIV.   Developing herpes virus.    TREATMENT  Sometimes BV will clear up without treatment. However, all women with symptoms of BV should be treated to avoid complications, especially if gynecology surgery is planned. Female partners generally do not need to be treated. However, BV may spread between female sex partners so treatment is helpful in preventing a recurrence of BV.   BV may be treated with antibiotics. The antibiotics come in either pill or vaginal cream forms. Either can be used with nonpregnant or pregnant women, but the recommended dosages differ. These antibiotics are not harmful to the baby.   BV can recur after treatment. If this happens, a second round of antibiotics will often be prescribed.   Treatment is important for pregnant women. If not treated, BV can cause a premature delivery, especially for a pregnant woman who had a premature birth in the past. All pregnant women who have symptoms of BV should be checked and treated.   For chronic reoccurrence of BV, treatment with a type of prescribed gel vaginally twice a week is helpful.  HOME CARE INSTRUCTIONS   Finish all   medication as directed by your caregiver.   Do not have sex until treatment is completed.   Tell your sexual partner that you have a vaginal infection. They should see their caregiver and be treated if they have problems, such as a mild rash or itching.   Practice safe sex. Use condoms. Only have 1 sex partner.  PREVENTION  Basic prevention steps can help reduce the risk of upsetting the natural balance of bacteria in the vagina and developing BV:  Do not have sexual intercourse (be abstinent).   Do not douche.   Use all of the medicine prescribed for treatment of BV, even if the signs and symptoms go away.   Tell your sex partner if you have BV. That way, they can be treated, if needed, to prevent reoccurrence.  SEEK MEDICAL CARE IF:   Your symptoms are not improving after 3 days of treatment.   You have increased discharge, pain,  or fever.  MAKE SURE YOU:   Understand these instructions.   Will watch your condition.   Will get help right away if you are not doing well or get worse.  FOR MORE INFORMATION  Division of STD Prevention (DSTDP), Centers for Disease Control and Prevention: www.cdc.gov/std American Social Health Association (ASHA): www.ashastd.org  Document Released: 11/01/2005 Document Revised: 10/21/2011 Document Reviewed: 04/24/2009 ExitCare Patient Information 2012 [employer]. 

## 2012-04-29 NOTE — MAU Note (Signed)
Patient has malodorous discharge. States wants to be checked for "everything" because her boyfriend may not have been treated the last time she had trich.

## 2012-04-29 NOTE — MAU Provider Note (Signed)
History     CSN: [id number]  Arrival date and time: 04/29/12 1851   None     Chief Complaint  Patient presents with  . Dysuria  . Urinary Urgency  . Nausea  . Abdominal Cramping   HPI  Pt is here with report of dysuria, frequency and lower pelvic pain x 4-5 days.  +vaginal odor, intermittent ; no itching.    Past Medical History  Diagnosis Date  . Anxiety   . MVP (mitral valve prolapse)   . Panic attacks   . Asthma     seasonal bronchitis    Past Surgical History  Procedure Date  . Cesarean section   . Tubal ligation     Family History  Problem Relation Age of Onset  . Other Neg Hx     History  Substance Use Topics  . Smoking status: Never Smoker   . Smokeless tobacco: Not on file  . Alcohol Use: Yes    Allergies:  Allergies  Allergen Reactions  . Ciprofloxacin Itching  . Codeine Itching    Ok with benadryl    No prescriptions prior to admission    Review of Systems  Constitutional: Negative for fever and chills.  Genitourinary: Positive for dysuria, urgency and frequency. Negative for flank pain.       +vaginal odor  Musculoskeletal: Positive for back pain (right flankpain).  All other systems reviewed and are negative.   Physical Exam   Blood pressure 109/77, pulse 85, temperature 99.2 F (37.3 C), height 5\' 1"  (1.549 m), weight 97.796 kg (215 lb 9.6 oz), last menstrual period 04/07/2012.  Physical Exam  Constitutional: She is oriented to person, place, and time. She appears well-developed and well-nourished.  HENT:  Head: Normocephalic.  Neck: Normal range of motion. Neck supple.  Cardiovascular: Normal rate, regular rhythm and normal heart sounds.   Respiratory: Effort normal and breath sounds normal.  GI: Soft. There is no tenderness.  Genitourinary: Cervix exhibits no motion tenderness, no discharge and no friability. No bleeding around the vagina. Vaginal discharge (white, thin) found.  Neurological: She is alert and oriented to  person, place, and time.  Skin: Skin is warm and dry.    MAU Course  Procedures Results for orders placed during the hospital encounter of 04/29/12 (from the past 24 hour(s))  URINALYSIS, ROUTINE W REFLEX MICROSCOPIC     Status: Abnormal   Collection Time   04/29/12  7:04 PM      Component Value Range   Color, Urine YELLOW  YELLOW   APPearance CLEAR  CLEAR   Specific Gravity, Urine 1.020  1.005 - 1.030   pH 6.0  5.0 - 8.0   Glucose, UA NEGATIVE  NEGATIVE mg/dL   Hgb urine dipstick NEGATIVE  NEGATIVE   Bilirubin Urine NEGATIVE  NEGATIVE   Ketones, ur NEGATIVE  NEGATIVE mg/dL   Protein, ur NEGATIVE  NEGATIVE mg/dL   Urobilinogen, UA 0.2  0.0 - 1.0 mg/dL   Nitrite NEGATIVE  NEGATIVE   Leukocytes, UA MODERATE (*) NEGATIVE  URINE MICROSCOPIC-ADD ON     Status: Abnormal   Collection Time   04/29/12  7:04 PM      Component Value Range   Squamous Epithelial / LPF FEW (*) RARE   WBC, UA 21-50  <3 WBC/hpf   RBC / HPF 0-2  <3 RBC/hpf   Bacteria, UA MANY (*) RARE  POCT PREGNANCY, URINE     Status: Normal   Collection Time   04/29/12  7:12  PM      Component Value Range   Preg Test, Ur NEGATIVE  NEGATIVE  WET PREP, GENITAL     Status: Abnormal   Collection Time   04/29/12  8:01 PM      Component Value Range   Yeast Wet Prep HPF POC NONE SEEN  NONE SEEN   Trich, Wet Prep NONE SEEN  NONE SEEN   Clue Cells Wet Prep HPF POC FEW (*) NONE SEEN   WBC, Wet Prep HPF POC FEW (*) NONE SEEN     Assessment and Plan  Bacterial Vaginosis UTI  Plan: RX Bactrim, Flagyl Urine Culture F/U prn [facility]   [facility] 04/29/2012, 7:52 PM

## 2012-05-01 LAB — GC/CHLAMYDIA PROBE AMP, GENITAL: GC Probe Amp, Genital: NEGATIVE

## 2012-11-15 ENCOUNTER — Encounter (HOSPITAL_COMMUNITY): Payer: Self-pay | Admitting: *Deleted

## 2012-11-15 ENCOUNTER — Inpatient Hospital Stay (HOSPITAL_COMMUNITY)
Admission: AD | Admit: 2012-11-15 | Discharge: 2012-11-15 | Disposition: A | Discharge status: Home / Self Care | Payer: Self-pay | Source: Ambulatory Visit | Attending: Obstetrics | Admitting: Obstetrics

## 2012-11-15 DIAGNOSIS — R350 Frequency of micturition: Secondary | ICD-10-CM | POA: Insufficient documentation

## 2012-11-15 DIAGNOSIS — N949 Unspecified condition associated with female genital organs and menstrual cycle: Secondary | ICD-10-CM | POA: Insufficient documentation

## 2012-11-15 DIAGNOSIS — B373 Candidiasis of vulva and vagina: Secondary | ICD-10-CM | POA: Insufficient documentation

## 2012-11-15 DIAGNOSIS — B3731 Acute candidiasis of vulva and vagina: Secondary | ICD-10-CM | POA: Insufficient documentation

## 2012-11-15 LAB — WET PREP, GENITAL
Clue Cells Wet Prep HPF POC: NONE SEEN
Trich, Wet Prep: NONE SEEN

## 2012-11-15 LAB — URINALYSIS, ROUTINE W REFLEX MICROSCOPIC
Bilirubin Urine: NEGATIVE
Hgb urine dipstick: NEGATIVE
Specific Gravity, Urine: 1.01 (ref 1.005–1.030)
pH: 5.5 (ref 5.0–8.0)

## 2012-11-15 MED ORDER — FLUCONAZOLE 150 MG PO TABS
150.0000 mg | ORAL_TABLET | Freq: Once | ORAL | Status: AC
  Administered 2012-11-15: 150 mg via ORAL
  Filled 2012-11-15: qty 1

## 2012-11-15 MED ORDER — FLUCONAZOLE 150 MG PO TABS: ORAL_TABLET | ORAL | Status: DC

## 2012-11-15 NOTE — MAU Note (Signed)
Urinary urgency & frequency for past 2 weeks, pt was treating herself with pyridium, azo, cranberry tablets.  Sx's went away but have now come back.  Pt also has discharge with itching.

## 2012-11-15 NOTE — MAU Provider Note (Signed)
History     CSN: [id number]  Arrival date and time: 11/15/12 1753   First Provider Initiated Contact with Patient 11/15/12 1846      Chief Complaint  Patient presents with  . Urinary Frequency   HPI Pt presents with frequency of urination and urgency for about 2 weeks.  Pt also complains of white vaginal discharge and itching.  Pt has been using OTC pyridium and azo, and cranberry tablets- pt got better and then the symptoms have returned.  Pt denies chills, fever, nausea or vomiting.  Pt had tubal ligation 13 years ago PP and then had an IUP  With SAB in 2011.  Pt is using condoms sometimes and withdrawal.  Pt had IC 1 week ago with mild discomfort.  Pt has had 3 partners in past year.  Past Medical History  Diagnosis Date  . Anxiety   . MVP (mitral valve prolapse)   . Panic attacks   . Asthma     seasonal bronchitis    Past Surgical History  Procedure Date  . Cesarean section   . Tubal ligation     Family History  Problem Relation Age of Onset  . Other Neg Hx     History  Substance Use Topics  . Smoking status: Never Smoker   . Smokeless tobacco: Not on file  . Alcohol Use: Yes     Comment: occasional alcohol    Allergies:  Allergies  Allergen Reactions  . Ciprofloxacin Itching  . Codeine Itching    Ok with benadryl    Prescriptions prior to admission  Medication Sig Dispense Refill  . AZO-CRANBERRY PO Take by mouth.      . BEE POLLEN PO Take by mouth. Diet pill      . Flaxseed, Linseed, (FLAX SEED OIL PO) Take by mouth. circulation      . fluocinonide cream (LIDEX) 0.05 % Apply topically 2 (two) times daily. rash      . ibuprofen (ADVIL,MOTRIN) 200 MG tablet Take 400 mg by mouth every 6 (six) hours as needed. Head ache      . OVER THE COUNTER MEDICATION       . ZINC-VITAMIN C MT Use as directed in the mouth or throat.      . [DISCONTINUED] PHENAZOPYRIDINE HCL PO Take 2 tablets by mouth daily as needed. Pain        Review of Systems  Constitutional:  Negative for fever and chills.  Gastrointestinal: Positive for abdominal pain. Negative for nausea, vomiting, diarrhea and constipation.  Genitourinary: Positive for frequency. Negative for urgency and hematuria.  Musculoskeletal: Positive for back pain.  Neurological: Negative for headaches.   Physical Exam   Blood pressure 113/77, pulse 82, temperature 98.1 F (36.7 C), temperature source Oral, resp. rate 18, last menstrual period 10/12/2012.  Physical Exam  Nursing note and vitals reviewed. Constitutional: She is oriented to person, place, and time. She appears well-developed.  HENT:  Head: Normocephalic.  Eyes: Pupils are equal, round, and reactive to light.  Neck: Normal range of motion. Neck supple.  Cardiovascular: Normal rate.   Respiratory: Effort normal.  GI: Soft. She exhibits no distension. There is no tenderness. There is no rebound and no guarding.  Genitourinary:       Vaginal mucosa slightly reddened, small amount of whtie discharge in vault; cervix parous, NT; uterus without palpable enlargement or tenderness; mild adnexal tenderness  Musculoskeletal: Normal range of motion.  Neurological: She is alert and oriented to person, place, and time.  Skin: Skin is warm and dry.       Cystic acne on face  Psychiatric: She has a normal mood and affect.    MAU Course  Procedures Results for orders placed during the hospital encounter of 11/15/12 (from the past 24 hour(s))  URINALYSIS, ROUTINE W REFLEX MICROSCOPIC     Status: Normal   Collection Time   11/15/12  6:05 PM      Component Value Range   Color, Urine YELLOW  YELLOW   APPearance CLEAR  CLEAR   Specific Gravity, Urine 1.010  1.005 - 1.030   pH 5.5  5.0 - 8.0   Glucose, UA NEGATIVE  NEGATIVE mg/dL   Hgb urine dipstick NEGATIVE  NEGATIVE   Bilirubin Urine NEGATIVE  NEGATIVE   Ketones, ur NEGATIVE  NEGATIVE mg/dL   Protein, ur NEGATIVE  NEGATIVE mg/dL   Urobilinogen, UA 0.2  0.0 - 1.0 mg/dL   Nitrite NEGATIVE   NEGATIVE   Leukocytes, UA NEGATIVE  NEGATIVE  POCT PREGNANCY, URINE     Status: Normal   Collection Time   11/15/12  6:14 PM      Component Value Range   Preg Test, Ur NEGATIVE  NEGATIVE  WET PREP, GENITAL     Status: Abnormal   Collection Time   11/15/12  6:55 PM      Component Value Range   Yeast Wet Prep HPF POC MODERATE (*) NONE SEEN   Trich, Wet Prep NONE SEEN  NONE SEEN   Clue Cells Wet Prep HPF POC NONE SEEN  NONE SEEN   WBC, Wet Prep HPF POC MODERATE (*) NONE SEEN  GC/Chlamydia pending Diflucan 150mg  PO given in MAU Assessment and Plan  Yeast vaginitis- prescription for DIflucan 150mg  PO #2 0 RF to repeat in 3 days if symptoms persist Advised pt to get more effective contraception- recommend possiblity of OCs to help control acne F/u with Dr. Gaynell Face condoms always  Pamelia Hoit 11/15/2012, 7:08 PM

## 2012-11-16 LAB — GC/CHLAMYDIA PROBE AMP
CT Probe RNA: NEGATIVE
GC Probe RNA: NEGATIVE

## 2012-12-07 ENCOUNTER — Encounter (HOSPITAL_COMMUNITY): Payer: Self-pay | Admitting: *Deleted

## 2012-12-07 ENCOUNTER — Other Ambulatory Visit: Payer: Self-pay

## 2012-12-07 ENCOUNTER — Emergency Department (HOSPITAL_COMMUNITY)
Admission: EM | Admit: 2012-12-07 | Discharge: 2012-12-08 | Disposition: A | Discharge status: Home / Self Care | Payer: Self-pay | Attending: Emergency Medicine | Admitting: Emergency Medicine

## 2012-12-07 DIAGNOSIS — F419 Anxiety disorder, unspecified: Secondary | ICD-10-CM

## 2012-12-07 DIAGNOSIS — F430 Acute stress reaction: Secondary | ICD-10-CM | POA: Insufficient documentation

## 2012-12-07 DIAGNOSIS — R0602 Shortness of breath: Secondary | ICD-10-CM

## 2012-12-07 DIAGNOSIS — F411 Generalized anxiety disorder: Secondary | ICD-10-CM | POA: Insufficient documentation

## 2012-12-07 DIAGNOSIS — Z8679 Personal history of other diseases of the circulatory system: Secondary | ICD-10-CM | POA: Insufficient documentation

## 2012-12-07 DIAGNOSIS — R0789 Other chest pain: Secondary | ICD-10-CM | POA: Insufficient documentation

## 2012-12-07 DIAGNOSIS — F439 Reaction to severe stress, unspecified: Secondary | ICD-10-CM

## 2012-12-07 DIAGNOSIS — R079 Chest pain, unspecified: Secondary | ICD-10-CM

## 2012-12-07 LAB — CBC WITH DIFFERENTIAL/PLATELET
Basophils Absolute: 0 10*3/uL (ref 0.0–0.1)
Eosinophils Absolute: 0.1 10*3/uL (ref 0.0–0.7)
Eosinophils Relative: 1 % (ref 0–5)
Lymphocytes Relative: 51 % — ABNORMAL HIGH (ref 12–46)
Lymphs Abs: 2.7 10*3/uL (ref 0.7–4.0)
MCV: 80.7 fL (ref 78.0–100.0)
Neutrophils Relative %: 38 % — ABNORMAL LOW (ref 43–77)
Platelets: 299 10*3/uL (ref 150–400)
RBC: 4.29 MIL/uL (ref 3.87–5.11)
RDW: 15.5 % (ref 11.5–15.5)
WBC: 5.3 10*3/uL (ref 4.0–10.5)

## 2012-12-07 LAB — COMPREHENSIVE METABOLIC PANEL
ALT: 10 U/L (ref 0–35)
AST: 12 U/L (ref 0–37)
Alkaline Phosphatase: 70 U/L (ref 39–117)
CO2: 29 mEq/L (ref 19–32)
Calcium: 8.8 mg/dL (ref 8.4–10.5)
Potassium: 3.6 mEq/L (ref 3.5–5.1)
Sodium: 139 mEq/L (ref 135–145)
Total Protein: 7.5 g/dL (ref 6.0–8.3)

## 2012-12-07 NOTE — ED Provider Notes (Signed)
History     CSN: [id number]  Arrival date & time 12/07/12  5621   First MD Initiated Contact with Patient 12/07/12 2302      Chief Complaint  Patient presents with  . Shortness of Breath    (Consider location/radiation/quality/duration/timing/severity/associated sxs/prior treatment) HPIGemia M Armstrong is a 37 y.o. female with a history of anxiety, panic attacks and mitral valve prolapse presents with intermittent two-day history of shortness of breath, she's had this before she says "a Bischof time ago" and at that time she was worked up and found to have mitral valve prolapse however her [place] cardiologist thought her symptoms were more likely caused by anxiety and stress. She's also had 2 days history of intermittent chest pain somewhat under the left breast this is sharp, however sometimes it is dull at different times it is different parts of her chest. It is 6/10 in intensity and is not currently present. No family history of sudden cardiac death, no history of thromboembolic disease, no hemoptysis, no recent surgery or immobilization, no history of early heart disease in her family.. Patient is not taking any contraceptive hormone pills. Patient denies any abdominal pain, nausea vomiting or diarrhea, diaphoresis, rash, recent illness fevers, chills or sore throat   Past Medical History  Diagnosis Date  . Anxiety   . MVP (mitral valve prolapse)   . Panic attacks   . Asthma     seasonal bronchitis    Past Surgical History  Procedure Date  . Cesarean section   . Tubal ligation     Family History  Problem Relation Age of Onset  . Other Neg Hx     History  Substance Use Topics  . Smoking status: Never Smoker   . Smokeless tobacco: Not on file  . Alcohol Use: Yes     Comment: occasional alcohol    OB History    Grav Para Term Preterm Abortions TAB SAB Ect Mult Living   5 4 4  0 1 0 1 0 0 4      Review of Systems ROS At least 10pt or greater review of systems  completed and are negative except where specified in the HPI.  Allergies  Ciprofloxacin and Codeine  Home Medications   Current Outpatient Rx  Name  Route  Sig  Dispense  Refill  . OMEGA-3 FATTY ACIDS 1000 MG PO CAPS   Oral   Take 1 g by mouth daily.         Marland Kitchen GARLIC PO   Oral   Take 1 capsule by mouth daily.         . IBUPROFEN 200 MG PO TABS   Oral   Take 400 mg by mouth every 8 (eight) hours as needed. Head ache         . ZINC-VITAMIN C 23-100 MG MT LOZG   Oral   Take 1 tablet by mouth daily.           BP 137/84  Pulse 83  Temp 98.3 F (36.8 C) (Oral)  Resp 18  SpO2 98%  LMP 11/30/2012  Physical Exam  Nursing notes reviewed.  Electronic medical record reviewed. VITAL SIGNS:   Filed Vitals:   12/07/12 1927  BP: 137/84  Pulse: 83  Temp: 98.3 F (36.8 C)  TempSrc: Oral  Resp: 18  SpO2: 98%   CONSTITUTIONAL: Awake, oriented, appears non-toxic HENT: Atraumatic, normocephalic, oral mucosa pink and moist, airway patent. Nares patent without drainage. External ears normal. EYES: Conjunctiva clear, EOMI,  PERRLA NECK: Trachea midline, non-tender, supple CARDIOVASCULAR: Normal heart rate, Normal rhythm, No murmurs, rubs, gallops PULMONARY/CHEST: Clear to auscultation, no rhonchi, wheezes, or rales. Symmetrical breath sounds. Non-tender. ABDOMINAL: Non-distended, soft, obese, non-tender - no rebound or guarding.  BS normal. NEUROLOGIC: Non-focal, moving all four extremities, no gross sensory or motor deficits. EXTREMITIES: No clubbing, cyanosis, or edema SKIN: Warm, Dry, No erythema, No rash  ED Course  Procedures (including critical care time)  Date: 12/08/2012  Rate: 71  Rhythm: normal sinus rhythm  QRS Axis: normal  Intervals: normal  ST/T Wave abnormalities: normal  Conduction Disutrbances: none  Narrative Interpretation: Normal sinus rhythm, no arrhythmia, no ST or T wave abnormalities suggestive of acute ischemia or infarction  Labs Reviewed   CBC WITH DIFFERENTIAL - Abnormal; Notable for the following:    Hemoglobin 11.3 (*)     HCT 34.6 (*)     Neutrophils Relative 38 (*)     Lymphocytes Relative 51 (*)     All other components within normal limits  COMPREHENSIVE METABOLIC PANEL - Abnormal; Notable for the following:    Total Bilirubin 0.2 (*)     All other components within normal limits  TROPONIN I  LAB REPORT - SCANNED   No results found.   1. Chest pain   2. Shortness of breath   3. Anxiety   4. Stress       MDM  Christina Armstrong is a 37 y.o. female presenting with atypical chest pain that moves around the last 2 days, she said some intermittent shortness of breath. Patient does have a history of anxiety and tach attacks. Patient has been stressed out per her boyfriend and she agrees that she's been stressed out as well. Labs show a mild anemia with an H&H of 11.3 and 34.6, she has had a history of this in the past, her MCV is 80.7 and again she is a history of iron deficiency anemia. I do not think this is causing her symptoms. Patient's EKG and troponin are unremarkable. For comprehensive metabolic panel is within normal limits.  Discussed stress relief and anxiety management with the patient - she says she was previously on Celexa and she did not like the way it made her feel so she DC'd it for herself. I urged her to continue proceeding diet and exercise as a way to manage her anxiety and to discuss this further with her primary care physician.  Do not think this patient has an acute intrathoracic emergency at this time, she is PERC negative, do not think this is ACS in this otherwise healthy 37 year old female. There are no abnormal breath sounds, no respiratory distress, patient will not need any further interventions or laboratory workup at this time.  I explained the diagnosis and have given explicit precautions to return to the ER including worsening chest pain is constant or in the same spot, constant shortness  of breath or any other new or worsening symptoms. The patient understands and accepts the medical plan as it's been dictated and I have answered their questions. Discharge instructions concerning home care and prescriptions have been given.  The patient is STABLE and is discharged to home in good condition.         Jones Skene, MD 12/08/12 [phone]

## 2012-12-07 NOTE — ED Notes (Signed)
Pt c/o sob x 2 days; states feels like a heavy feeling; has had a few "little" chest pains; pain under left breast area at times; states for "a while" her right arm aches and wakes her up at night; pt at rest when chest pain starts; sob comes and goes; denies fever or cough

## 2013-02-03 ENCOUNTER — Emergency Department (HOSPITAL_COMMUNITY)
Admission: EM | Admit: 2013-02-03 | Discharge: 2013-02-04 | Disposition: A | Discharge status: Home / Self Care | Payer: Self-pay | Attending: Emergency Medicine | Admitting: Emergency Medicine

## 2013-02-03 ENCOUNTER — Encounter (HOSPITAL_COMMUNITY): Payer: Self-pay | Admitting: *Deleted

## 2013-02-03 DIAGNOSIS — F411 Generalized anxiety disorder: Secondary | ICD-10-CM | POA: Insufficient documentation

## 2013-02-03 DIAGNOSIS — Z79899 Other long term (current) drug therapy: Secondary | ICD-10-CM | POA: Insufficient documentation

## 2013-02-03 DIAGNOSIS — J45901 Unspecified asthma with (acute) exacerbation: Secondary | ICD-10-CM | POA: Insufficient documentation

## 2013-02-03 DIAGNOSIS — R072 Precordial pain: Secondary | ICD-10-CM | POA: Insufficient documentation

## 2013-02-03 DIAGNOSIS — R0602 Shortness of breath: Secondary | ICD-10-CM | POA: Insufficient documentation

## 2013-02-03 DIAGNOSIS — R002 Palpitations: Secondary | ICD-10-CM | POA: Insufficient documentation

## 2013-02-03 DIAGNOSIS — Z8679 Personal history of other diseases of the circulatory system: Secondary | ICD-10-CM | POA: Insufficient documentation

## 2013-02-03 DIAGNOSIS — Z3202 Encounter for pregnancy test, result negative: Secondary | ICD-10-CM | POA: Insufficient documentation

## 2013-02-03 DIAGNOSIS — Z8659 Personal history of other mental and behavioral disorders: Secondary | ICD-10-CM

## 2013-02-03 DIAGNOSIS — R079 Chest pain, unspecified: Secondary | ICD-10-CM

## 2013-02-04 ENCOUNTER — Emergency Department (HOSPITAL_COMMUNITY): Payer: Self-pay

## 2013-02-04 LAB — CBC
HCT: 37.3 % (ref 36.0–46.0)
Hemoglobin: 12.3 g/dL (ref 12.0–15.0)
MCH: 25.3 pg — ABNORMAL LOW (ref 26.0–34.0)
MCHC: 33 g/dL (ref 30.0–36.0)
MCV: 76.7 fL — ABNORMAL LOW (ref 78.0–100.0)
RDW: 15.4 % (ref 11.5–15.5)

## 2013-02-04 LAB — BASIC METABOLIC PANEL
BUN: 9 mg/dL (ref 6–23)
Calcium: 9.3 mg/dL (ref 8.4–10.5)
Creatinine, Ser: 0.7 mg/dL (ref 0.50–1.10)
GFR calc non Af Amer: >90 mL/min (ref >90)
Glucose, Bld: 98 mg/dL (ref 70–99)

## 2013-02-04 LAB — POCT PREGNANCY, URINE: Preg Test, Ur: NEGATIVE

## 2013-02-04 MED ORDER — POTASSIUM CHLORIDE CRYS ER 20 MEQ PO TBCR
40.0000 meq | EXTENDED_RELEASE_TABLET | Freq: Once | ORAL | Status: AC
  Administered 2013-02-04: 40 meq via ORAL
  Filled 2013-02-04: qty 2

## 2013-02-04 NOTE — ED Provider Notes (Signed)
Medical screening examination/treatment/procedure(s) were performed by non-physician practitioner and as supervising physician I was immediately available for consultation/collaboration.  John-Adam Shanita Kanan, M.D.   John-Adam Saphia Vanderford, MD 02/04/13 1000 

## 2013-02-04 NOTE — ED Provider Notes (Signed)
History     CSN: [id number]  Arrival date & time 02/03/13  2244   First MD Initiated Contact with Patient 02/04/13 0031      Chief Complaint  Patient presents with  . Chest Pain    (Consider location/radiation/quality/duration/timing/severity/associated sxs/prior treatment) HPI Comments: Patient is a 37 y/o F with PMHx of panic attacks, MVP, anxiety c/o chest pain. Patient reported that pain started at approximately 10:00pm yesterday (02/03/2013) described as a sharp, shooting pain that predominantly affected the substernal region, with radiation to the back. Patient reported difficulty taking deep breathes. Patient reported that she was yelling and extremely upset before having this episode. Denied nausea, vomiting, visual distortions, headaches, neck pain.   Patient is a 37 y.o. female presenting with chest pain. The history is provided by the patient. No language interpreter was used.  Chest Pain Pain location:  Substernal area Pain quality: pressure and sharp   Pain radiates to:  Does not radiate Pain radiates to the back: yes   Pain severity:  Mild Onset quality:  Sudden Duration:  3 hours Timing:  Rare Progression:  Improving Chronicity:  New Context: breathing and stress   Context: not at rest   Relieved by:  Nothing Worsened by:  Deep breathing Ineffective treatments:  None tried Associated symptoms: anxiety, palpitations and shortness of breath   Associated symptoms: no abdominal pain, no fever, no nausea and not vomiting   Risk factors: not pregnant and no smoking     Past Medical History  Diagnosis Date  . Anxiety   . MVP (mitral valve prolapse)   . Panic attacks   . Asthma     seasonal bronchitis    Past Surgical History  Procedure Laterality Date  . Cesarean section    . Tubal ligation      Family History  Problem Relation Age of Onset  . Other Neg Hx     History  Substance Use Topics  . Smoking status: Never Smoker   . Smokeless tobacco: Not  on file  . Alcohol Use: Yes     Comment: occasional alcohol    OB History   Grav Para Term Preterm Abortions TAB SAB Ect Mult Living   5 4 4  0 1 0 1 0 0 4      Review of Systems  Constitutional: Negative for fever.  HENT: Negative for hearing loss and neck pain.   Respiratory: Positive for shortness of breath.   Cardiovascular: Positive for chest pain and palpitations.  Gastrointestinal: Negative for nausea, vomiting, abdominal pain and diarrhea.  10 Systems reviewed and are negative for acute change except as noted in the HPI.   Allergies  Ciprofloxacin and Codeine  Home Medications   Current Outpatient Rx  Name  Route  Sig  Dispense  Refill  . acetaminophen (TYLENOL) 500 MG tablet   Oral   Take 500 mg by mouth every 6 (six) hours as needed for pain.         . Flaxseed, Linseed, (FLAX SEED OIL PO)   Oral   Take 1 capsule by mouth every morning.         Marland Kitchen ibuprofen (ADVIL,MOTRIN) 200 MG tablet   Oral   Take 400 mg by mouth every 8 (eight) hours as needed for pain or headache. Head ache         . Multiple Vitamin (MULTIVITAMIN WITH MINERALS) TABS   Oral   Take 1 tablet by mouth every morning.         Marland Kitchen  OVER THE COUNTER MEDICATION   Oral   Take 1 tablet by mouth every morning.         . vitamin C (ASCORBIC ACID) 500 MG tablet   Oral   Take 500 mg by mouth every morning.           BP 116/92  Pulse 85  Temp(Src) 98.7 F (37.1 C) (Oral)  Resp 17  SpO2 100%  LMP 01/10/2013  Physical Exam  Nursing note and vitals reviewed. Constitutional: She is oriented to person, place, and time. She appears well-developed and well-nourished. No distress.  Eyes: Conjunctivae and EOM are normal. Pupils are equal, round, and reactive to light. Right eye exhibits no discharge. Left eye exhibits no discharge.  Neck: Normal range of motion. Neck supple.  Cardiovascular: Normal rate, regular rhythm, normal heart sounds and intact distal pulses.  Exam reveals no  friction rub.   No murmur heard. Pulmonary/Chest: Effort normal and breath sounds normal. She has no wheezes. She has no rales.  Abdominal: Soft. Bowel sounds are normal. She exhibits no distension. There is no tenderness.  Lymphadenopathy:    She has no cervical adenopathy.  Neurological: She is alert and oriented to person, place, and time.  Skin: Skin is warm and dry. No rash noted. She is not diaphoretic. No erythema.    ED Course  Procedures (including critical care time)  Labs Reviewed  CBC - Abnormal; Notable for the following:    MCV 76.7 (*)    MCH 25.3 (*)    All other components within normal limits  BASIC METABOLIC PANEL - Abnormal; Notable for the following:    Sodium 134 (*)    Potassium 3.1 (*)    All other components within normal limits  POCT I-STAT TROPONIN I  POCT PREGNANCY, URINE   Dg Chest Port 1 View  02/04/2013  *RADIOLOGY REPORT*  Clinical Data: Chest pain, shortness of breath  PORTABLE CHEST - 1 VIEW  Comparison: 07/18/2011  Findings: Lungs are clear. No pleural effusion or pneumothorax. The cardiomediastinal contours are within normal limits. The visualized bones and soft tissues are without significant appreciable abnormality.  IMPRESSION: No radiographic evidence of acute cardiopulmonary process.   Original Report Authenticated By: Jearld Lesch, M.D.    Results for orders placed during the hospital encounter of 02/03/13  CBC      Result Value Range   WBC 5.0  4.0 - 10.5 K/uL   RBC 4.86  3.87 - 5.11 MIL/uL   Hemoglobin 12.3  12.0 - 15.0 g/dL   HCT 45.4  09.8 - 11.9 %   MCV 76.7 (*) 78.0 - 100.0 fL   MCH 25.3 (*) 26.0 - 34.0 pg   MCHC 33.0  30.0 - 36.0 g/dL   RDW 14.7  82.9 - 56.2 %   Platelets 247  150 - 400 K/uL  BASIC METABOLIC PANEL      Result Value Range   Sodium 134 (*) 135 - 145 mEq/L   Potassium 3.1 (*) 3.5 - 5.1 mEq/L   Chloride 98  96 - 112 mEq/L   CO2 27  19 - 32 mEq/L   Glucose, Bld 98  70 - 99 mg/dL   BUN 9  6 - 23 mg/dL    Creatinine, Ser 1.30  0.50 - 1.10 mg/dL   Calcium 9.3  8.4 - 86.5 mg/dL   GFR calc non Af Amer >90  >90 mL/min   GFR calc Af Amer >90  >90 mL/min  POCT I-STAT  TROPONIN I      Result Value Range   Troponin i, poc 0.00  0.00 - 0.08 ng/mL   Comment 3           POCT PREGNANCY, URINE      Result Value Range   Preg Test, Ur NEGATIVE  NEGATIVE   Dg Chest Port 1 View  02/04/2013  *RADIOLOGY REPORT*  Clinical Data: Chest pain, shortness of breath  PORTABLE CHEST - 1 VIEW  Comparison: 07/18/2011  Findings: Lungs are clear. No pleural effusion or pneumothorax. The cardiomediastinal contours are within normal limits. The visualized bones and soft tissues are without significant appreciable abnormality.  IMPRESSION: No radiographic evidence of acute cardiopulmonary process.   Original Report Authenticated By: Jearld Lesch, M.D.      Date: 02/04/2013  Rate: 99 bpm  Rhythm: normal sinus rhythm  QRS Axis: normal  Intervals: normal  ST/T Wave abnormalities: normal  Conduction Disutrbances:none  Narrative Interpretation:   Old EKG Reviewed: unchanged  Filed Vitals:   02/04/13 0129  BP: 116/92  Pulse: 85  Temp:   Resp: 17     1. Chest pain   2. History of anxiety       MDM  Patient is a 37 y/o presenting with chest pain. Patient reported being upset and yelling when episode came on - reported chest pain with shooting pain in substernal region with radiation to back that lasted a few seconds, associated with difficulty taking deep breathes. Denied fever, headaches, neck pain.  I personally evaluated and examined the patient. Patient alert, calm. Reported that chest pain improved. Cardiac: normal S1/S2 heard, regular rate and rhythm. Lungs: CTA b/l, no wheezes or crackles.  CBC negative findings CMP low potassium - 40 mEq potassium PO given Urine pregnancy negative Chest x-ray no evidence of cardiopulmonary process Troponins negative (0.00) EKG - no sign of infarction  noted  Patient afebrile, normotensive, non-tachycardic, alert and in happy affect. Patient aseptic. Discharged patient. Discussed patient to follow-up with physician, gave patient reference list. Discussed to take multivitamin daily. Educated patient on anxiety and panic attacks. Discussed that if symptoms are to worsen please report back to the ED.  Patient agreed to plan of care, understood, all questions answered.         Raymon Mutton, PA-C 02/04/13 [phone]

## 2013-10-19 ENCOUNTER — Encounter (HOSPITAL_COMMUNITY): Payer: Self-pay | Admitting: Emergency Medicine

## 2013-10-19 ENCOUNTER — Other Ambulatory Visit: Payer: Self-pay

## 2013-10-19 ENCOUNTER — Emergency Department (HOSPITAL_COMMUNITY)
Admission: EM | Admit: 2013-10-19 | Discharge: 2013-10-19 | Discharge status: Against Medical Advice | Payer: Self-pay | Attending: Emergency Medicine | Admitting: Emergency Medicine

## 2013-10-19 DIAGNOSIS — R002 Palpitations: Secondary | ICD-10-CM | POA: Insufficient documentation

## 2013-10-19 DIAGNOSIS — J45909 Unspecified asthma, uncomplicated: Secondary | ICD-10-CM | POA: Insufficient documentation

## 2013-10-19 NOTE — ED Notes (Signed)
Patient is alert and orietned x3.  She is complaining of racing heart and palpations.  She is dealing with stressors in her life  From relationships and teenage sons.

## 2013-12-29 ENCOUNTER — Encounter (HOSPITAL_COMMUNITY): Payer: Self-pay | Admitting: Emergency Medicine

## 2013-12-29 ENCOUNTER — Emergency Department (HOSPITAL_COMMUNITY)
Admission: EM | Admit: 2013-12-29 | Discharge: 2013-12-29 | Disposition: A | Discharge status: Home / Self Care | Payer: Self-pay | Attending: Emergency Medicine | Admitting: Emergency Medicine

## 2013-12-29 ENCOUNTER — Emergency Department (HOSPITAL_COMMUNITY): Payer: Self-pay

## 2013-12-29 DIAGNOSIS — R0789 Other chest pain: Secondary | ICD-10-CM | POA: Insufficient documentation

## 2013-12-29 DIAGNOSIS — Z8679 Personal history of other diseases of the circulatory system: Secondary | ICD-10-CM | POA: Insufficient documentation

## 2013-12-29 DIAGNOSIS — R079 Chest pain, unspecified: Secondary | ICD-10-CM

## 2013-12-29 DIAGNOSIS — Z8659 Personal history of other mental and behavioral disorders: Secondary | ICD-10-CM | POA: Insufficient documentation

## 2013-12-29 DIAGNOSIS — Z79899 Other long term (current) drug therapy: Secondary | ICD-10-CM | POA: Insufficient documentation

## 2013-12-29 DIAGNOSIS — J45901 Unspecified asthma with (acute) exacerbation: Secondary | ICD-10-CM | POA: Insufficient documentation

## 2013-12-29 LAB — BASIC METABOLIC PANEL
BUN: 9 mg/dL (ref 6–23)
CALCIUM: 9.3 mg/dL (ref 8.4–10.5)
CHLORIDE: 101 meq/L (ref 96–112)
CO2: 27 meq/L (ref 19–32)
Creatinine, Ser: 0.71 mg/dL (ref 0.50–1.10)
GFR calc non Af Amer: >90 mL/min (ref >90)
Glucose, Bld: 87 mg/dL (ref 70–99)
Potassium: 3.7 mEq/L (ref 3.7–5.3)
SODIUM: 139 meq/L (ref 137–147)

## 2013-12-29 LAB — POCT I-STAT TROPONIN I: Troponin i, poc: 0 ng/mL (ref 0.00–0.08)

## 2013-12-29 LAB — CBC
HCT: 33.7 % — ABNORMAL LOW (ref 36.0–46.0)
Hemoglobin: 11 g/dL — ABNORMAL LOW (ref 12.0–15.0)
MCH: 24.7 pg — ABNORMAL LOW (ref 26.0–34.0)
MCHC: 32.6 g/dL (ref 30.0–36.0)
MCV: 75.7 fL — ABNORMAL LOW (ref 78.0–100.0)
PLATELETS: 315 10*3/uL (ref 150–400)
RBC: 4.45 MIL/uL (ref 3.87–5.11)
RDW: 16.1 % — AB (ref 11.5–15.5)
WBC: 7.3 10*3/uL (ref 4.0–10.5)

## 2013-12-29 MED ORDER — LORAZEPAM 1 MG PO TABS
1.0000 mg | ORAL_TABLET | Freq: Once | ORAL | Status: AC
  Administered 2013-12-29: 1 mg via ORAL
  Filled 2013-12-29: qty 1

## 2013-12-29 NOTE — ED Notes (Signed)
Pt reports intermittent left-sided chest pain, palpitations, dizziness and shortness of breath x2 days - worse at night. Denies fever,cough, or n/v.

## 2013-12-29 NOTE — ED Provider Notes (Signed)
CSN: [id number]     Arrival date & time 12/29/13  0034 History   First MD Initiated Contact with Patient 12/29/13 0119     Chief Complaint  Patient presents with  . Shortness of Breath  . Chest Pain     (Consider location/radiation/quality/duration/timing/severity/associated sxs/prior Treatment) HPI History provided by patient. Has a history of anxiety, panic attacks, mitral valve prolapse and presents tonight with left-sided chest pain - nonradiating, sharp in quality and points with one finger where hurts. She also has some shortness of breath described as feels like he can't take a deep breath. No associated cough, fever, nausea, vomiting, lower extremity swelling, or lower extreme pain. Symptoms started 2 days ago have been intermittent in tonight unable to sleep due to her symptoms. She was evaluated by cardiologist years ago when she was diagnosed with mitral valve prolapse but has not followed up since that time. No recent travel, surgery. No history of DVT or PE. No family history of early coronary artery disease or sudden death.   Past Medical History  Diagnosis Date  . Anxiety   . MVP (mitral valve prolapse)   . Panic attacks   . Asthma     seasonal bronchitis   Past Surgical History  Procedure Laterality Date  . Cesarean section    . Tubal ligation     Family History  Problem Relation Age of Onset  . Other Neg Hx    History  Substance Use Topics  . Smoking status: Never Smoker   . Smokeless tobacco: Not on file  . Alcohol Use: Yes     Comment: occasional alcohol   OB History   Grav Para Term Preterm Abortions TAB SAB Ect Mult Living   5 4 4  0 1 0 1 0 0 4     Review of Systems  Constitutional: Negative for fever and chills.  Respiratory: Positive for shortness of breath. Negative for cough and wheezing.   Cardiovascular: Positive for chest pain.  Gastrointestinal: Negative for vomiting and abdominal pain.  Genitourinary: Negative for dysuria.   Musculoskeletal: Negative for back pain.  Skin: Negative for rash.  Neurological: Negative for headaches.  All other systems reviewed and are negative.      Allergies  Ciprofloxacin and Codeine  Home Medications   Current Outpatient Rx  Name  Route  Sig  Dispense  Refill  . acetaminophen (TYLENOL) 500 MG tablet   Oral   Take 500 mg by mouth every 6 (six) hours as needed for pain.         . Flaxseed, Linseed, (FLAX SEED OIL PO)   Oral   Take 1 capsule by mouth every morning.         Marland Kitchen ibuprofen (ADVIL,MOTRIN) 200 MG tablet   Oral   Take 400 mg by mouth every 8 (eight) hours as needed for pain or headache. Head ache         . Multiple Vitamin (MULTIVITAMIN WITH MINERALS) TABS   Oral   Take 1 tablet by mouth every morning.         . vitamin C (ASCORBIC ACID) 500 MG tablet   Oral   Take 500 mg by mouth every morning.          BP 124/85  Pulse 78  Temp(Src) 98.7 F (37.1 C) (Oral)  Resp 20  Ht 5\' 2"  (1.575 m)  Wt 215 lb (97.523 kg)  BMI 39.31 kg/m2  SpO2 100%  LMP 12/15/2013 Physical Exam  Constitutional: She  is oriented to person, place, and time. She appears well-developed and well-nourished.  HENT:  Head: Normocephalic and atraumatic.  Mouth/Throat: Oropharynx is clear and moist.  Eyes: EOM are normal. Pupils are equal, round, and reactive to light.  Neck: Neck supple.  Cardiovascular: Normal rate, regular rhythm and intact distal pulses.   Pulmonary/Chest: Effort normal and breath sounds normal. No respiratory distress. She exhibits no tenderness.  Abdominal: Soft. Bowel sounds are normal. She exhibits no distension. There is no tenderness. There is no rebound and no guarding.  Musculoskeletal: Normal range of motion. She exhibits no edema and no tenderness.  Neurological: She is alert and oriented to person, place, and time.  Skin: Skin is warm and dry.    ED Course  Procedures (including critical care time) Labs Review Labs Reviewed  CBC  - Abnormal; Notable for the following:    Hemoglobin 11.0 (*)    HCT 33.7 (*)    MCV 75.7 (*)    MCH 24.7 (*)    RDW 16.1 (*)    All other components within normal limits  BASIC METABOLIC PANEL  POCT I-STAT TROPONIN I   Imaging Review Dg Chest 2 View  12/29/2013   CLINICAL DATA:  Shortness of breath, chest pain, cough, and congestion for 2 days.  EXAM: CHEST  2 VIEW  COMPARISON:  02/04/2013  FINDINGS: The cardiomediastinal silhouette is within normal limits. The lungs are well inflated and clear. There is no evidence of pleural effusion or pneumothorax. No acute osseous abnormality is identified.  IMPRESSION: No active cardiopulmonary disease.   Electronically Signed   By: Sebastian AcheAllen  Grady   On: 12/29/2013 02:04    EKG Interpretation    Date/Time:  Saturday December 29 2013 00:50:35 EST Ventricular Rate:  74 PR Interval:  137 QRS Duration: 75 QT Interval:  382 QTC Calculation: 424 R Axis:   43 Text Interpretation:  Sinus rhythm Nonspecific ST abnormality Confirmed by Joann Kulpa  MD, Jahad Old ([id number]) on 12/29/2013 2:04:45 AM           Previous records reviewed, multiple ED visits with similar symptoms. Ativan provided.  4:11 AM patient is asymptomatic, lung sounds remained clear with normal vital signs. Plan discharge home with close outpatient followup. Outpatient referral provided. Patient agrees to strict return precautions, stable for discharge at this time  MDM   Diagnosis: Chest pain, shortness of breath  Having symptoms for 2 days, EKG reviewed as above. Chest x-ray no infiltrate or pneumothorax. Labs reviewed as above including negative troponin. Symptoms resolved. Very low risk for ACS.     Sunnie NielsenBrian Sahaana Weitman, MD 12/29/13 [phone]

## 2013-12-29 NOTE — ED Notes (Signed)
Pt. On cardiac monitor. 

## 2013-12-29 NOTE — Discharge Instructions (Signed)
Chest Pain (Nonspecific) °It is often hard to give a specific diagnosis for the cause of chest pain. There is always a chance that your pain could be related to something serious, such as a heart attack or a blood clot in the lungs. You need to follow up with your caregiver for further evaluation. °CAUSES  °· Heartburn. °· Pneumonia or bronchitis. °· Anxiety or stress. °· Inflammation around your heart (pericarditis) or lung (pleuritis or pleurisy). °· A blood clot in the lung. °· A collapsed lung (pneumothorax). It can develop suddenly on its own (spontaneous pneumothorax) or from injury (trauma) to the chest. °· Shingles infection (herpes zoster virus). °The chest wall is composed of bones, muscles, and cartilage. Any of these can be the source of the pain. °· The bones can be bruised by injury. °· The muscles or cartilage can be strained by coughing or overwork. °· The cartilage can be affected by inflammation and become sore (costochondritis). °DIAGNOSIS  °Lab tests or other studies, such as X-rays, electrocardiography, stress testing, or cardiac imaging, may be needed to find the cause of your pain.  °TREATMENT  °· Treatment depends on what may be causing your chest pain. Treatment may include: °· Acid blockers for heartburn. °· Anti-inflammatory medicine. °· Pain medicine for inflammatory conditions. °· Antibiotics if an infection is present. °· You may be advised to change lifestyle habits. This includes stopping smoking and avoiding alcohol, caffeine, and chocolate. °· You may be advised to keep your head raised (elevated) when sleeping. This reduces the chance of acid going backward from your stomach into your esophagus. °· Most of the time, nonspecific chest pain will improve within 2 to 3 days with rest and mild pain medicine. °HOME CARE INSTRUCTIONS  °· If antibiotics were prescribed, take your antibiotics as directed. Finish them even if you start to feel better. °· For the next few days, avoid physical  activities that bring on chest pain. Continue physical activities as directed. °· Do not smoke. °· Avoid drinking alcohol. °· Only take over-the-counter or prescription medicine for pain, discomfort, or fever as directed by your caregiver. °· Follow your caregiver's suggestions for further testing if your chest pain does not go away. °· Keep any follow-up appointments you made. If you do not go to an appointment, you could develop lasting (chronic) problems with pain. If there is any problem keeping an appointment, you must call to reschedule. °SEEK MEDICAL CARE IF:  °· You think you are having problems from the medicine you are taking. Read your medicine instructions carefully. °· Your chest pain does not go away, even after treatment. °· You develop a rash with blisters on your chest. °SEEK IMMEDIATE MEDICAL CARE IF:  °· You have increased chest pain or pain that spreads to your arm, neck, jaw, back, or abdomen. °· You develop shortness of breath, an increasing cough, or you are coughing up blood. °· You have severe back or abdominal pain, feel nauseous, or vomit. °· You develop severe weakness, fainting, or chills. °· You have a fever. °THIS IS AN EMERGENCY. Do not wait to see if the pain will go away. Get medical help at once. Call your local emergency services (911 in [place] Do not drive yourself to the hospital. °MAKE SURE YOU:  °· Understand these instructions. °· Will watch your condition. °· Will get help right away if you are not doing well or get worse. °Document Released: 08/11/2005 Document Revised: 01/24/2012 Document Reviewed: 06/06/2008 °ExitCare® Patient Information ©2014 [employer]. ° °

## 2014-09-16 ENCOUNTER — Encounter (HOSPITAL_COMMUNITY): Payer: Self-pay | Admitting: Emergency Medicine

## 2014-10-03 ENCOUNTER — Emergency Department (HOSPITAL_BASED_OUTPATIENT_CLINIC_OR_DEPARTMENT_OTHER): Payer: Medicaid Other

## 2014-10-03 ENCOUNTER — Emergency Department (HOSPITAL_BASED_OUTPATIENT_CLINIC_OR_DEPARTMENT_OTHER)
Admission: EM | Admit: 2014-10-03 | Discharge: 2014-10-03 | Disposition: A | Discharge status: Home / Self Care | Payer: Medicaid Other | Attending: Emergency Medicine | Admitting: Emergency Medicine

## 2014-10-03 ENCOUNTER — Encounter (HOSPITAL_BASED_OUTPATIENT_CLINIC_OR_DEPARTMENT_OTHER): Payer: Self-pay | Admitting: *Deleted

## 2014-10-03 DIAGNOSIS — Z8659 Personal history of other mental and behavioral disorders: Secondary | ICD-10-CM | POA: Insufficient documentation

## 2014-10-03 DIAGNOSIS — J45909 Unspecified asthma, uncomplicated: Secondary | ICD-10-CM | POA: Insufficient documentation

## 2014-10-03 DIAGNOSIS — Z79899 Other long term (current) drug therapy: Secondary | ICD-10-CM | POA: Diagnosis not present

## 2014-10-03 DIAGNOSIS — Z3202 Encounter for pregnancy test, result negative: Secondary | ICD-10-CM | POA: Diagnosis not present

## 2014-10-03 DIAGNOSIS — N201 Calculus of ureter: Secondary | ICD-10-CM

## 2014-10-03 DIAGNOSIS — R109 Unspecified abdominal pain: Secondary | ICD-10-CM | POA: Insufficient documentation

## 2014-10-03 DIAGNOSIS — Z8679 Personal history of other diseases of the circulatory system: Secondary | ICD-10-CM | POA: Diagnosis not present

## 2014-10-03 LAB — URINALYSIS, ROUTINE W REFLEX MICROSCOPIC
Bilirubin Urine: NEGATIVE
GLUCOSE, UA: NEGATIVE mg/dL
KETONES UR: NEGATIVE mg/dL
NITRITE: NEGATIVE
PH: 7.5 (ref 5.0–8.0)
Protein, ur: 30 mg/dL — AB
SPECIFIC GRAVITY, URINE: 1.011 (ref 1.005–1.030)
Urobilinogen, UA: 0.2 mg/dL (ref 0.0–1.0)

## 2014-10-03 LAB — CBC WITH DIFFERENTIAL/PLATELET
BASOS ABS: 0 10*3/uL (ref 0.0–0.1)
Basophils Relative: 0 % (ref 0–1)
EOS ABS: 0.1 10*3/uL (ref 0.0–0.7)
EOS PCT: 1 % (ref 0–5)
HCT: 30.8 % — ABNORMAL LOW (ref 36.0–46.0)
Hemoglobin: 9.7 g/dL — ABNORMAL LOW (ref 12.0–15.0)
LYMPHS PCT: 32 % (ref 12–46)
Lymphs Abs: 2.2 10*3/uL (ref 0.7–4.0)
MCH: 22.1 pg — ABNORMAL LOW (ref 26.0–34.0)
MCHC: 31.5 g/dL (ref 30.0–36.0)
MCV: 70.3 fL — AB (ref 78.0–100.0)
Monocytes Absolute: 0.8 10*3/uL (ref 0.1–1.0)
Monocytes Relative: 11 % (ref 3–12)
NEUTROS PCT: 56 % (ref 43–77)
Neutro Abs: 3.8 10*3/uL (ref 1.7–7.7)
Platelets: 292 10*3/uL (ref 150–400)
RBC: 4.38 MIL/uL (ref 3.87–5.11)
RDW: 17 % — AB (ref 11.5–15.5)
WBC: 6.8 10*3/uL (ref 4.0–10.5)

## 2014-10-03 LAB — BASIC METABOLIC PANEL
ANION GAP: 11 (ref 5–15)
BUN: 8 mg/dL (ref 6–23)
CO2: 25 mEq/L (ref 19–32)
Calcium: 9.2 mg/dL (ref 8.4–10.5)
Chloride: 105 mEq/L (ref 96–112)
Creatinine, Ser: 0.6 mg/dL (ref 0.50–1.10)
GFR calc Af Amer: >90 mL/min (ref >90)
Glucose, Bld: 88 mg/dL (ref 70–99)
POTASSIUM: 3.9 meq/L (ref 3.7–5.3)
Sodium: 141 mEq/L (ref 137–147)

## 2014-10-03 LAB — PREGNANCY, URINE: Preg Test, Ur: NEGATIVE

## 2014-10-03 LAB — URINE MICROSCOPIC-ADD ON

## 2014-10-03 MED ORDER — FENTANYL CITRATE 0.05 MG/ML IJ SOLN
50.0000 ug | Freq: Once | INTRAMUSCULAR | Status: AC
  Administered 2014-10-03: 50 ug via INTRAVENOUS
  Filled 2014-10-03: qty 2

## 2014-10-03 MED ORDER — PHENAZOPYRIDINE HCL 200 MG PO TABS: 200.0000 mg | ORAL_TABLET | Freq: Three times a day (TID) | ORAL | Status: DC

## 2014-10-03 MED ORDER — TAMSULOSIN HCL 0.4 MG PO CAPS: 0.4000 mg | ORAL_CAPSULE | Freq: Every day | ORAL | Status: DC

## 2014-10-03 MED ORDER — KETOROLAC TROMETHAMINE 30 MG/ML IJ SOLN
15.0000 mg | Freq: Once | INTRAMUSCULAR | Status: AC
  Administered 2014-10-03: 15 mg via INTRAVENOUS
  Filled 2014-10-03: qty 1

## 2014-10-03 MED ORDER — HYDROCODONE-ACETAMINOPHEN 5-325 MG PO TABS: 1.0000 | ORAL_TABLET | Freq: Four times a day (QID) | ORAL | Status: DC | PRN

## 2014-10-03 NOTE — Discharge Instructions (Signed)

## 2014-10-03 NOTE — ED Notes (Signed)
Pt c/o right flank and back pain that began this morning. Pt also c/o frequency and painful urination.

## 2014-10-04 NOTE — ED Provider Notes (Signed)
CSN: [id number]     Arrival date & time 10/03/14  1328 History   First MD Initiated Contact with Patient 10/03/14 1505     Chief Complaint  Patient presents with  . Flank Pain     (Consider location/radiation/quality/duration/timing/severity/associated sxs/prior Treatment) Patient is a 38 y.o. female presenting with flank pain. The history is provided by the patient. No language interpreter was used.  Flank Pain This is a new problem. The current episode started 3 to 5 hours ago. Episode frequency: intermittent. The problem has not changed since onset.Pertinent negatives include no chest pain, no abdominal pain, no headaches and no shortness of breath. Nothing aggravates the symptoms. Nothing relieves the symptoms. She has tried nothing for the symptoms. The treatment provided no relief.    Past Medical History  Diagnosis Date  . Anxiety   . MVP (mitral valve prolapse)   . Panic attacks   . Asthma     seasonal bronchitis   Past Surgical History  Procedure Laterality Date  . Cesarean section    . Tubal ligation     Family History  Problem Relation Age of Onset  . Other Neg Hx    History  Substance Use Topics  . Smoking status: Never Smoker   . Smokeless tobacco: Not on file  . Alcohol Use: Yes     Comment: occasional alcohol   OB History    Gravida Para Term Preterm AB TAB SAB Ectopic Multiple Living   5 4 4  0 1 0 1 0 0 4     Review of Systems  Constitutional: Negative for fever, chills, diaphoresis, activity change, appetite change and fatigue.  HENT: Negative for congestion, facial swelling, rhinorrhea and sore throat.   Eyes: Negative for photophobia and discharge.  Respiratory: Negative for cough, chest tightness and shortness of breath.   Cardiovascular: Negative for chest pain, palpitations and leg swelling.  Gastrointestinal: Negative for nausea, vomiting, abdominal pain and diarrhea.  Endocrine: Negative for polydipsia and polyuria.  Genitourinary: Positive  for dysuria, frequency and flank pain. Negative for difficulty urinating and pelvic pain.  Musculoskeletal: Negative for back pain, arthralgias, neck pain and neck stiffness.  Skin: Negative for color change and wound.  Allergic/Immunologic: Negative for immunocompromised state.  Neurological: Negative for facial asymmetry, weakness, numbness and headaches.  Hematological: Does not bruise/bleed easily.  Psychiatric/Behavioral: Negative for confusion and agitation.      Allergies  Ciprofloxacin and Codeine  Home Medications   Prior to Admission medications   Medication Sig Start Date End Date Taking? Authorizing Provider  acetaminophen (TYLENOL) 500 MG tablet Take 500 mg by mouth every 6 (six) hours as needed for pain.    Historical Provider, MD  Flaxseed, Linseed, (FLAX SEED OIL PO) Take 1 capsule by mouth every morning.    Historical Provider, MD  HYDROcodone-acetaminophen (NORCO) 5-325 MG per tablet Take 1 tablet by mouth every 6 (six) hours as needed. 10/03/14   Toy CookeyMegan Docherty, MD  ibuprofen (ADVIL,MOTRIN) 200 MG tablet Take 400 mg by mouth every 8 (eight) hours as needed for pain or headache. Head ache    Historical Provider, MD  Multiple Vitamin (MULTIVITAMIN WITH MINERALS) TABS Take 1 tablet by mouth every morning.    Historical Provider, MD  phenazopyridine (PYRIDIUM) 200 MG tablet Take 1 tablet (200 mg total) by mouth 3 (three) times daily. 10/03/14   Toy CookeyMegan Docherty, MD  tamsulosin (FLOMAX) 0.4 MG CAPS capsule Take 1 capsule (0.4 mg total) by mouth daily. 10/03/14   Toy CookeyMegan Docherty,  MD  vitamin C (ASCORBIC ACID) 500 MG tablet Take 500 mg by mouth every morning.    Historical Provider, MD   BP 115/73 mmHg  Pulse 73  Temp(Src) 99 F (37.2 C) (Oral)  Resp 16  Ht 5\' 2"  (1.575 m)  Wt 215 lb (97.523 kg)  BMI 39.31 kg/m2  SpO2 100%  LMP 09/02/2014 Physical Exam  Constitutional: She is oriented to person, place, and time. She appears well-developed and well-nourished. No  distress.  HENT:  Head: Normocephalic and atraumatic.  Mouth/Throat: No oropharyngeal exudate.  Eyes: Pupils are equal, round, and reactive to light.  Neck: Normal range of motion. Neck supple.  Cardiovascular: Normal rate, regular rhythm and normal heart sounds.  Exam reveals no gallop and no friction rub.   No murmur heard. Pulmonary/Chest: Effort normal and breath sounds normal. No respiratory distress. She has no wheezes. She has no rales.  Abdominal: Soft. Bowel sounds are normal. She exhibits no distension and no mass. There is no tenderness. There is no rebound and no guarding.  Musculoskeletal: Normal range of motion. She exhibits no edema or tenderness.  Neurological: She is alert and oriented to person, place, and time.  Skin: Skin is warm and dry.  Psychiatric: She has a normal mood and affect.    ED Course  Procedures (including critical care time) Labs Review Labs Reviewed  URINALYSIS, ROUTINE W REFLEX MICROSCOPIC - Abnormal; Notable for the following:    APPearance CLOUDY (*)    Hgb urine dipstick MODERATE (*)    Protein, ur 30 (*)    Leukocytes, UA MODERATE (*)    All other components within normal limits  URINE MICROSCOPIC-ADD ON - Abnormal; Notable for the following:    Bacteria, UA MANY (*)    All other components within normal limits  CBC WITH DIFFERENTIAL - Abnormal; Notable for the following:    Hemoglobin 9.7 (*)    HCT 30.8 (*)    MCV 70.3 (*)    MCH 22.1 (*)    RDW 17.0 (*)    All other components within normal limits  PREGNANCY, URINE  BASIC METABOLIC PANEL    Imaging Review Ct Renal Stone Study  10/03/2014   CLINICAL DATA:  RIGHT flank pain, blood in urine for 1 day  EXAM: CT ABDOMEN AND PELVIS WITHOUT CONTRAST  TECHNIQUE: Multidetector CT imaging of the abdomen and pelvis was performed following the standard protocol without IV contrast. Sagittal and coronal MPR images reconstructed from axial data set. Oral contrast not administered for this  indication.  COMPARISON:  None  FINDINGS: Lung bases clear.  Mild RIGHT hydroureter.  Slight effacement of RIGHT renal sinus that without gross hydronephrosis.  Minimal peripelvic edema RIGHT kidney.  3 mm diameter calcification at RIGHT bladder base, suspect distal RIGHT ureteral calculus, distal RIGHT ureter difficult to localize on nonenhanced exam.  No additional urinary tract calcification or LEFT ureteral dilatation.  Within limits of a nonenhanced exam no focal abnormalities of the liver, spleen, pancreas, LEFT kidney, or adrenal glands.  Normal appendix.  Unremarkable bladder, LEFT ureter, uterus and ovaries.  Stomach and bowel loops grossly normal appearance for technique.  No mass, adenopathy, free fluid, or free air.  No hernia or acute bone lesion.  IMPRESSION: RIGHT ureteral dilatation and minimal RIGHT peripelvic edema with a 3 mm diameter calcification at the RIGHT bladder base, suspect distal RIGHT ureteral calculus.  No additional intra-abdominal or intrapelvic abnormalities.   Electronically Signed   By: Angelyn Punt.D.  On: 10/03/2014 16:23     EKG Interpretation None      MDM   Final diagnoses:  Right flank pain  Right ureteral stone    Pt is a 38 y.o. female with Pmhx as above who presents with intermittent R flank pain of one day, and urinary frequency and dysuria since last night. No fever, chills, n/v, d/a. On PE VSS, pt in NAD. Cardiopulm exam benign.  UA w/ moderate Hgb, moderate leukocytes. Given clinical concern for stone, CT ordered and showed 3mm R distal ureteral stone. Pt felt improved after IV toradol & morphine. I believe urinary findings reactive from stone and given nml WBC and no fever, will not start on abx at this point. Pt given flomax and narcotics for pain. Return precautions given for new or worsening symptoms including worsening pain, fever, inbility to tolerate PO. She can f/u with uro as needed as outpt.          Toy CookeyMegan Docherty, MD 10/04/14  [phone]

## 2014-12-13 ENCOUNTER — Inpatient Hospital Stay (HOSPITAL_COMMUNITY)
Admission: AD | Admit: 2014-12-13 | Discharge: 2014-12-13 | Disposition: A | Discharge status: Home / Self Care | Payer: Medicaid Other | Source: Ambulatory Visit | Attending: Obstetrics | Admitting: Obstetrics

## 2014-12-13 ENCOUNTER — Encounter (HOSPITAL_COMMUNITY): Payer: Self-pay | Admitting: *Deleted

## 2014-12-13 DIAGNOSIS — N12 Tubulo-interstitial nephritis, not specified as acute or chronic: Secondary | ICD-10-CM | POA: Diagnosis not present

## 2014-12-13 DIAGNOSIS — R509 Fever, unspecified: Secondary | ICD-10-CM | POA: Diagnosis present

## 2014-12-13 LAB — URINALYSIS, ROUTINE W REFLEX MICROSCOPIC
BILIRUBIN URINE: NEGATIVE
Glucose, UA: NEGATIVE mg/dL
Ketones, ur: NEGATIVE mg/dL
NITRITE: NEGATIVE
PH: 6 (ref 5.0–8.0)
Protein, ur: 100 mg/dL — AB
Specific Gravity, Urine: 1.015 (ref 1.005–1.030)
UROBILINOGEN UA: 0.2 mg/dL (ref 0.0–1.0)

## 2014-12-13 LAB — CBC
HCT: 33.9 % — ABNORMAL LOW (ref 36.0–46.0)
HEMOGLOBIN: 10.7 g/dL — AB (ref 12.0–15.0)
MCH: 22 pg — ABNORMAL LOW (ref 26.0–34.0)
MCHC: 31.6 g/dL (ref 30.0–36.0)
MCV: 69.6 fL — AB (ref 78.0–100.0)
Platelets: 228 10*3/uL (ref 150–400)
RBC: 4.87 MIL/uL (ref 3.87–5.11)
RDW: 18.3 % — AB (ref 11.5–15.5)
WBC: 14.9 10*3/uL — ABNORMAL HIGH (ref 4.0–10.5)

## 2014-12-13 LAB — URINE MICROSCOPIC-ADD ON

## 2014-12-13 LAB — WET PREP, GENITAL
Trich, Wet Prep: NONE SEEN
YEAST WET PREP: NONE SEEN

## 2014-12-13 LAB — POCT PREGNANCY, URINE: PREG TEST UR: NEGATIVE

## 2014-12-13 MED ORDER — KETOROLAC TROMETHAMINE 60 MG/2ML IM SOLN
60.0000 mg | Freq: Once | INTRAMUSCULAR | Status: AC
  Administered 2014-12-13: 60 mg via INTRAMUSCULAR
  Filled 2014-12-13: qty 2

## 2014-12-13 MED ORDER — SULFAMETHOXAZOLE-TRIMETHOPRIM 800-160 MG PO TABS: 1.0000 | ORAL_TABLET | Freq: Two times a day (BID) | ORAL | Status: AC

## 2014-12-13 MED ORDER — CEFTRIAXONE SODIUM 1 G IJ SOLR
1.0000 g | Freq: Once | INTRAMUSCULAR | Status: AC
  Administered 2014-12-13: 1 g via INTRAMUSCULAR
  Filled 2014-12-13: qty 10

## 2014-12-13 MED ORDER — FLUCONAZOLE 150 MG PO TABS: 150.0000 mg | ORAL_TABLET | Freq: Every day | ORAL | Status: DC

## 2014-12-13 MED ORDER — PHENAZOPYRIDINE HCL 100 MG PO TABS
200.0000 mg | ORAL_TABLET | Freq: Once | ORAL | Status: AC
  Administered 2014-12-13: 200 mg via ORAL
  Filled 2014-12-13: qty 2

## 2014-12-13 NOTE — MAU Note (Signed)
Yesterday, started feeling really bad: body aches, fever (101). No cough or sore throat. Took some ibuprofen and some cold medicine.  Felt a little better.  Been in the bed all day, just ache all over, started hurting on left side and back on left side.   Has pain and burning with urination. (rt CVA tenderness).  Strong smell in urine and it is cloudy.

## 2014-12-13 NOTE — Discharge Instructions (Signed)
Pyelonephritis, Adult °Pyelonephritis is a kidney infection. A kidney infection can happen quickly, or it can last for a Erazo time. °HOME CARE  °· Take your medicine (antibiotics) as told. Finish it even if you start to feel better. °· Keep all doctor visits as told. °· Drink enough fluids to keep your pee (urine) clear or pale yellow. °· Only take medicine as told by your doctor. °GET HELP RIGHT AWAY IF:  °· You have a fever or lasting symptoms for more than 2-3 days. °· You have a fever and your symptoms suddenly get worse. °· You cannot take your medicine or drink fluids as told. °· You have chills and shaking. °· You feel very weak or pass out (faint). °· You do not feel better after 2 days. °MAKE SURE YOU: °· Understand these instructions. °· Will watch your condition. °· Will get help right away if you are not doing well or get worse. °Document Released: 12/09/2004 Document Revised: 05/02/2012 Document Reviewed: 04/21/2011 °ExitCare® Patient Information ©2015 [employer]. This information is not intended to replace advice given to you by your health care provider. Make sure you discuss any questions you have with your health care provider. ° °

## 2014-12-13 NOTE — MAU Provider Note (Signed)
History     CSN: [id number]  Arrival date and time: 12/13/14 1810   First Provider Initiated Contact with Patient 12/13/14 2011      No chief complaint on file.  HPI   Christina Armstrong is a 39 y.o. female [phone] who presents with fever, left sided back pain, fever, chills and odorous urine. She is having frequency and discomfort with urination.  The fever, body aches, cold chills started yesterday. She feels like today the symptoms have gotten worse. She denies flu symptoms, however does have a head cold.   OB History    Gravida Para Term Preterm AB TAB SAB Ectopic Multiple Living   0 1 0 1 0 0 4      Past Medical History  Diagnosis Date  . Anxiety   . MVP (mitral valve prolapse)   . Panic attacks   . Asthma     seasonal bronchitis    Past Surgical History  Procedure Laterality Date  . Cesarean section    . Tubal ligation      Family History  Problem Relation Age of Onset  . Other Neg Hx     History  Substance Use Topics  . Smoking status: Never Smoker   . Smokeless tobacco: Not on file  . Alcohol Use: Yes     Comment: occasional alcohol    Allergies:  Allergies  Allergen Reactions  . Ciprofloxacin Itching  . Codeine Itching    Ok with benadryl    Prescriptions prior to admission  Medication Sig Dispense Refill Last Dose  . acetaminophen (TYLENOL) 500 MG tablet Take 500 mg by mouth every 6 (six) hours as needed for pain.   12/12/2014 at Unknown time  . ibuprofen (ADVIL,MOTRIN) 200 MG tablet Take 400 mg by mouth every 8 (eight) hours as needed for pain or headache. Head ache   12/13/2014 at Unknown time  . Multiple Vitamin (MULTIVITAMIN WITH MINERALS) TABS Take 1 tablet by mouth every morning.   Past Month at Unknown time  . OVER THE COUNTER MEDICATION Take 2 capsules by mouth daily. Herbal Weight Loss Remedy   Past Week at Unknown time  . Phenylephrine-DM-GG-APAP (MUCINEX FAST-MAX COLD FLU PO) Take 15 mLs by mouth every 6 (six) hours as needed  (cold symptoms).   12/13/2014 at Unknown time  . vitamin C (ASCORBIC ACID) 500 MG tablet Take 500 mg by mouth every morning.   Past Month at Unknown time  . HYDROcodone-acetaminophen (NORCO) 5-325 MG per tablet Take 1 tablet by mouth every 6 (six) hours as needed. (Patient not taking: Reported on 12/13/2014) 20 tablet 0   . phenazopyridine (PYRIDIUM) 200 MG tablet Take 1 tablet (200 mg total) by mouth 3 (three) times daily. (Patient not taking: Reported on 12/13/2014) 6 tablet 0   . tamsulosin (FLOMAX) 0.4 MG CAPS capsule Take 1 capsule (0.4 mg total) by mouth daily. (Patient not taking: Reported on 12/13/2014) 30 capsule 0    Results for orders placed or performed during the hospital encounter of 12/13/14 (from the past 48 hour(s))  Urinalysis, Routine w reflex microscopic     Status: Abnormal   Collection Time: 12/13/14  6:50 PM  Result Value Ref Range   Color, Urine YELLOW YELLOW   APPearance CLOUDY (A) CLEAR   Specific Gravity, Urine 1.015 1.005 - 1.030   pH 6.0 5.0 - 8.0   Glucose, UA NEGATIVE NEGATIVE mg/dL   Hgb urine dipstick LARGE (A) NEGATIVE   Bilirubin Urine  NEGATIVE NEGATIVE   Ketones, ur NEGATIVE NEGATIVE mg/dL   Protein, ur 161 (A) NEGATIVE mg/dL   Urobilinogen, UA 0.2 0.0 - 1.0 mg/dL   Nitrite NEGATIVE NEGATIVE   Leukocytes, UA LARGE (A) NEGATIVE  Urine microscopic-add on     Status: Abnormal   Collection Time: 12/13/14  6:50 PM  Result Value Ref Range   Squamous Epithelial / LPF RARE RARE   WBC, UA TOO NUMEROUS TO COUNT <3 WBC/hpf   RBC / HPF 0-2 <3 RBC/hpf   Bacteria, UA FEW (A) RARE  CBC     Status: Abnormal   Collection Time: 12/13/14  7:00 PM  Result Value Ref Range   WBC 14.9 (H) 4.0 - 10.5 K/uL   RBC 4.87 3.87 - 5.11 MIL/uL   Hemoglobin 10.7 (L) 12.0 - 15.0 g/dL   HCT 09.6 (L) 04.5 - 40.9 %   MCV 69.6 (L) 78.0 - 100.0 fL   MCH 22.0 (L) 26.0 - 34.0 pg   MCHC 31.6 30.0 - 36.0 g/dL   RDW 81.1 (H) 91.4 - 78.2 %   Platelets 228 150 - 400 K/uL  Pregnancy, urine  POC     Status: None   Collection Time: 12/13/14  7:01 PM  Result Value Ref Range   Preg Test, Ur NEGATIVE NEGATIVE    Comment:        THE SENSITIVITY OF THIS METHODOLOGY IS >24 mIU/mL   Wet prep, genital     Status: Abnormal   Collection Time: 12/13/14  8:49 PM  Result Value Ref Range   Yeast Wet Prep HPF POC NONE SEEN NONE SEEN   Trich, Wet Prep NONE SEEN NONE SEEN   Clue Cells Wet Prep HPF POC FEW (A) NONE SEEN   WBC, Wet Prep HPF POC RARE (A) NONE SEEN    Comment: MODERATE    Review of Systems  Constitutional: Positive for fever and chills.  HENT: Negative for sore throat.   Respiratory: Positive for cough.   Gastrointestinal: Positive for abdominal pain (Left sided ).  Genitourinary: Positive for dysuria, urgency, frequency and flank pain (Left side ).       + vaginal discharge.  Musculoskeletal: Positive for back pain.  Neurological: Positive for headaches.   Physical Exam   Blood pressure 103/71, pulse 146, temperature 98.7 F (37.1 C), temperature source Oral, resp. rate 18, height 5' 2.5" (1.588 m), weight 104.781 kg (231 lb), last menstrual period 11/19/2014, SpO2 100 %.  Physical Exam  Constitutional: She is oriented to person, place, and time. She appears well-developed and well-nourished.  Non-toxic appearance. She does not have a sickly appearance. She does not appear ill. No distress.  HENT:  Head: Normocephalic.  Eyes: Pupils are equal, round, and reactive to light.  Neck: Neck supple.  Cardiovascular: Normal rate and normal heart sounds.   Respiratory: Effort normal and breath sounds normal. No respiratory distress.  GI: Soft. There is tenderness in the left upper quadrant and left lower quadrant. There is CVA tenderness (Left sided ).  Genitourinary:  Speculum exam: Vagina - Small amount of creamy discharge, no odor Cervix - No contact bleeding Bimanual exam: Cervix closed Uterus non tender, normal size Adnexa non tender, no masses  bilaterally GC/Chlam, wet prep done Chaperone present for exam.   Musculoskeletal: Normal range of motion.  Neurological: She is alert and oriented to person, place, and time.  Skin: Skin is warm. She is not diaphoretic.  Psychiatric: Her behavior is normal.    MAU Course  Procedures  None  MDM Urine culture pending  Rocephin 1 gram in MAU Toradol 60 mg IM Pyridium 200 mg PO  Assessment and Plan   A:  1. Pyelonephritis     P:  Discharge home in stable condition RX: Bactrim X 10 days         Diflucan Urine culture pending Return to MAU if symptoms worsen Increase PO fluid intake.  Probiotics   Iona HansenJennifer Irene Ryker [place], NP 12/13/2014 9:38 PM

## 2014-12-15 ENCOUNTER — Encounter (HOSPITAL_COMMUNITY): Payer: Self-pay | Admitting: *Deleted

## 2014-12-15 ENCOUNTER — Inpatient Hospital Stay (HOSPITAL_COMMUNITY)
Admission: AD | Admit: 2014-12-15 | Discharge: 2014-12-15 | Disposition: A | Discharge status: Home / Self Care | Payer: Medicaid Other | Source: Ambulatory Visit | Attending: Obstetrics | Admitting: Obstetrics

## 2014-12-15 DIAGNOSIS — N12 Tubulo-interstitial nephritis, not specified as acute or chronic: Secondary | ICD-10-CM | POA: Diagnosis not present

## 2014-12-15 DIAGNOSIS — R109 Unspecified abdominal pain: Secondary | ICD-10-CM | POA: Diagnosis present

## 2014-12-15 DIAGNOSIS — R079 Chest pain, unspecified: Secondary | ICD-10-CM | POA: Insufficient documentation

## 2014-12-15 LAB — COMPREHENSIVE METABOLIC PANEL
ALBUMIN: 3.2 g/dL — AB (ref 3.5–5.2)
ALT: 20 U/L (ref 0–35)
ANION GAP: 6 (ref 5–15)
AST: 20 U/L (ref 0–37)
Alkaline Phosphatase: 72 U/L (ref 39–117)
BUN: 11 mg/dL (ref 6–23)
CHLORIDE: 106 mmol/L (ref 96–112)
CO2: 25 mmol/L (ref 19–32)
Calcium: 8.4 mg/dL (ref 8.4–10.5)
Creatinine, Ser: 1.25 mg/dL — ABNORMAL HIGH (ref 0.50–1.10)
GFR calc Af Amer: 63 mL/min — ABNORMAL LOW (ref >90)
GFR calc non Af Amer: 55 mL/min — ABNORMAL LOW (ref >90)
Glucose, Bld: 116 mg/dL — ABNORMAL HIGH (ref 70–99)
Potassium: 3.6 mmol/L (ref 3.5–5.1)
SODIUM: 137 mmol/L (ref 135–145)
Total Bilirubin: 0.8 mg/dL (ref 0.3–1.2)
Total Protein: 6.9 g/dL (ref 6.0–8.3)

## 2014-12-15 LAB — CBC
HEMATOCRIT: 30.2 % — AB (ref 36.0–46.0)
Hemoglobin: 9.8 g/dL — ABNORMAL LOW (ref 12.0–15.0)
MCH: 22 pg — AB (ref 26.0–34.0)
MCHC: 32.5 g/dL (ref 30.0–36.0)
MCV: 67.9 fL — ABNORMAL LOW (ref 78.0–100.0)
Platelets: 189 10*3/uL (ref 150–400)
RBC: 4.45 MIL/uL (ref 3.87–5.11)
RDW: 18.3 % — ABNORMAL HIGH (ref 11.5–15.5)
WBC: 18.5 10*3/uL — AB (ref 4.0–10.5)

## 2014-12-15 MED ORDER — SULFAMETHOXAZOLE-TRIMETHOPRIM 800-160 MG PO TABS
1.0000 | ORAL_TABLET | Freq: Once | ORAL | Status: AC
  Administered 2014-12-15: 1 via ORAL
  Filled 2014-12-15: qty 1

## 2014-12-15 NOTE — MAU Provider Note (Signed)
History     CSN: [id number]  Arrival date and time: 12/15/14 0031   None     Chief Complaint  Patient presents with  . Abdominal Pain  . Tachycardia   HPI  Ms. Pardini is a 39 yo [id number] female here tonight with report of heart racing and onset of dull midsternal chest pain at 1900 tonight.  Pain is intermittent occuring every 15 minutes and last for "about a minute or two".   Laying supine triggers the tachycardia and the feeling of heart jumping.  Denies radiation of pain or any alleviating factors.  Seen in [facility] yesterday and diagnosed with pyelonephritis.  Given IV rocephin and sent home on PO bactrim.  Reports taking one pill since discharge yesterday.    Reports cessation of fever, body aches and chills and flank pain is significantly better.    Past Medical History  Diagnosis Date  . Anxiety   . MVP (mitral valve prolapse)   . Panic attacks   . Asthma     seasonal bronchitis    Past Surgical History  Procedure Laterality Date  . Cesarean section    . Tubal ligation      Family History  Problem Relation Age of Onset  . Other Neg Hx     History  Substance Use Topics  . Smoking status: Never Smoker   . Smokeless tobacco: Not on file  . Alcohol Use: Yes     Comment: occasional alcohol    Allergies:  Allergies  Allergen Reactions  . Ciprofloxacin Itching  . Codeine Itching    Ok with benadryl    Prescriptions prior to admission  Medication Sig Dispense Refill Last Dose  . acetaminophen (TYLENOL) 500 MG tablet Take 500 mg by mouth every 6 (six) hours as needed for pain.   Past Week at Unknown time  . ibuprofen (ADVIL,MOTRIN) 200 MG tablet Take 400 mg by mouth every 8 (eight) hours as needed for pain or headache. Head ache   12/14/2014 at Unknown time  . Multiple Vitamin (MULTIVITAMIN WITH MINERALS) TABS Take 1 tablet by mouth every morning.   Past Week at Unknown time  . OVER THE COUNTER MEDICATION Take 2 capsules by mouth daily. Herbal  Weight Loss Remedy   Past Week at Unknown time  . Phenylephrine-DM-GG-APAP (MUCINEX FAST-MAX COLD FLU PO) Take 15 mLs by mouth every 6 (six) hours as needed (cold symptoms).   Past Week at Unknown time  . sulfamethoxazole-trimethoprim (BACTRIM DS,SEPTRA DS) 800-160 MG per tablet Take 1 tablet by mouth 2 (two) times daily. 20 tablet 0 12/14/2014 at Unknown time  . vitamin C (ASCORBIC ACID) 500 MG tablet Take 500 mg by mouth every morning.   Past Week at Unknown time  . fluconazole (DIFLUCAN) 150 MG tablet Take 1 tablet (150 mg total) by mouth daily. 1 tablet 1     Review of Systems  Constitutional: Negative for fever, chills and diaphoresis.  Respiratory: Negative for shortness of breath.   Cardiovascular: Positive for chest pain and palpitations.  Gastrointestinal: Positive for abdominal pain. Negative for nausea and vomiting.  Genitourinary: Negative for flank pain.  Musculoskeletal: Negative for myalgias.  All other systems reviewed and are negative.  Physical Exam   Blood pressure 139/71, pulse 117, temperature 98.7 F (37.1 C), resp. rate 18, height  (1.575 m), weight 104.327 kg (230 lb), last menstrual period 11/19/2014, SpO2 100 %.  Physical Exam  Constitutional: She is oriented to person, place, and time. She  appears well-developed and well-nourished. No distress.  HENT:  Head: Normocephalic.  Neck: Normal range of motion. Neck supple.  Cardiovascular: Regular rhythm and normal heart sounds.   Tachycardia  Respiratory: Effort normal and breath sounds normal. No respiratory distress.  GI: Soft. There is no tenderness.  Musculoskeletal: Normal range of motion. She exhibits no edema.  Neurological: She is alert and oriented to person, place, and time. She has normal reflexes.  Skin: Skin is warm and dry.    MAU Course  Procedures Results for orders placed or performed during the hospital encounter of 12/15/14 (from the past 24 hour(s))  CBC     Status: Abnormal    Collection Time: 12/15/14  1:10 AM  Result Value Ref Range   WBC 18.5 (H) 4.0 - 10.5 K/uL   RBC 4.45 3.87 - 5.11 MIL/uL   Hemoglobin 9.8 (L) 12.0 - 15.0 g/dL   HCT 13.030.2 (L) 86.536.0 - 78.446.0 %   MCV 67.9 (L) 78.0 - 100.0 fL   MCH 22.0 (L) 26.0 - 34.0 pg   MCHC 32.5 30.0 - 36.0 g/dL   RDW 69.618.3 (H) 29.511.5 - 28.415.5 %   Platelets 189 150 - 400 K/uL  Comprehensive metabolic panel     Status: Abnormal   Collection Time: 12/15/14  1:10 AM  Result Value Ref Range   Sodium 137 135 - 145 mmol/L   Potassium 3.6 3.5 - 5.1 mmol/L   Chloride 106 96 - 112 mmol/L   CO2 25 19 - 32 mmol/L   Glucose, Bld 116 (H) 70 - 99 mg/dL   BUN 11 6 - 23 mg/dL   Creatinine, Ser 1.321.25 (H) 0.50 - 1.10 mg/dL   Calcium 8.4 8.4 - 44.010.5 mg/dL   Total Protein 6.9 6.0 - 8.3 g/dL   Albumin 3.2 (L) 3.5 - 5.2 g/dL   AST 20 0 - 37 U/L   ALT 20 0 - 35 U/L   Alkaline Phosphatase 72 39 - 117 U/L   Total Bilirubin 0.8 0.3 - 1.2 mg/dL   GFR calc non Af Amer 55 (L) >90 mL/min   GFR calc Af Amer 63 (L) >90 mL/min   Anion gap 6 5 - 15     0205 Cardiologist Dr. Dorris FetchJ Kelly returns call > reviewed HPI/exam/medical history/EKG > no further testing indicated at this time, consider repeating prior to discharge (EKG) if extended stay  0210 Consulted with Dr. Adrian BlackwaterStinson > reviewed HPI/med history/exam/labs/EKG/consult with cardiologist > emphasize importance to patient regarding taking antibiotics and follow-up with PCP on Monday.    Given one dose of PO Bactrim in MAU  Assessment and Plan  Pyelonephritis Chest Pain of unknown etiology  Plan: - Discharge to home - Explained the importance of taking antibiotics on schedule to keep levels appropriate in system; explained potential consequences including sepsis, acute renal failure > pt verbalizes understanding and partner will encourage partner taking prescribed meds as directed. - Plans to follow-up with [facility] on Monday  Elenora FenderKARIM, [facility] CenterWALIDAH N 12/15/2014, 1:15 AM

## 2014-12-15 NOTE — Progress Notes (Signed)
W. Karim CNM in to discuss test results and d/c plan. Written and verbal d/c instructions given and understanding voiced. 

## 2014-12-15 NOTE — Discharge Instructions (Signed)
Pyelonephritis, Adult °Pyelonephritis is a kidney infection. A kidney infection can happen quickly, or it can last for a [place] time. °HOME CARE  °· Take your medicine (antibiotics) as told. Finish it even if you start to feel better. °· Keep all doctor visits as told. °· Drink enough fluids to keep your pee (urine) clear or pale yellow. °· Only take medicine as told by your doctor. °GET HELP RIGHT AWAY IF:  °· You have a fever or lasting symptoms for more than 2-3 days. °· You have a fever and your symptoms suddenly get worse. °· You cannot take your medicine or drink fluids as told. °· You have chills and shaking. °· You feel very weak or pass out (faint). °· You do not feel better after 2 days. °MAKE SURE YOU: °· Understand these instructions. °· Will watch your condition. °· Will get help right away if you are not doing well or get worse. °Document Released: 12/09/2004 Document Revised: 05/02/2012 Document Reviewed: 04/21/2011 °ExitCare® Patient Information ©2015 [employer]. This information is not intended to replace advice given to you by your health care provider. Make sure you discuss any questions you have with your health care provider. ° °

## 2014-12-15 NOTE — MAU Note (Signed)
"  Was seen here yesterday for kidney infection. My heart has been racing and I'm alittle short of breath. Very tired. Still having some L flank pain. Mostly concerned about heart racing

## 2014-12-16 DIAGNOSIS — K5900 Constipation, unspecified: Secondary | ICD-10-CM | POA: Diagnosis not present

## 2014-12-16 DIAGNOSIS — Z3202 Encounter for pregnancy test, result negative: Secondary | ICD-10-CM | POA: Insufficient documentation

## 2014-12-16 DIAGNOSIS — Z9889 Other specified postprocedural states: Secondary | ICD-10-CM | POA: Insufficient documentation

## 2014-12-16 DIAGNOSIS — Z9851 Tubal ligation status: Secondary | ICD-10-CM | POA: Insufficient documentation

## 2014-12-16 DIAGNOSIS — Z792 Long term (current) use of antibiotics: Secondary | ICD-10-CM | POA: Insufficient documentation

## 2014-12-16 DIAGNOSIS — F410 Panic disorder [episodic paroxysmal anxiety] without agoraphobia: Secondary | ICD-10-CM | POA: Insufficient documentation

## 2014-12-16 DIAGNOSIS — R112 Nausea with vomiting, unspecified: Secondary | ICD-10-CM | POA: Diagnosis present

## 2014-12-16 DIAGNOSIS — N3090 Cystitis, unspecified without hematuria: Secondary | ICD-10-CM | POA: Insufficient documentation

## 2014-12-16 DIAGNOSIS — J45909 Unspecified asthma, uncomplicated: Secondary | ICD-10-CM | POA: Diagnosis not present

## 2014-12-16 LAB — URINE CULTURE

## 2014-12-16 LAB — GC/CHLAMYDIA PROBE AMP (~~LOC~~) NOT AT ARMC
CHLAMYDIA, DNA PROBE: NEGATIVE
Neisseria Gonorrhea: NEGATIVE

## 2014-12-17 ENCOUNTER — Emergency Department (HOSPITAL_BASED_OUTPATIENT_CLINIC_OR_DEPARTMENT_OTHER)
Admission: EM | Admit: 2014-12-17 | Discharge: 2014-12-17 | Disposition: A | Discharge status: Home / Self Care | Payer: Medicaid Other | Attending: Emergency Medicine | Admitting: Emergency Medicine

## 2014-12-17 ENCOUNTER — Emergency Department (HOSPITAL_BASED_OUTPATIENT_CLINIC_OR_DEPARTMENT_OTHER): Payer: Medicaid Other

## 2014-12-17 ENCOUNTER — Encounter (HOSPITAL_BASED_OUTPATIENT_CLINIC_OR_DEPARTMENT_OTHER): Payer: Self-pay | Admitting: *Deleted

## 2014-12-17 DIAGNOSIS — N3090 Cystitis, unspecified without hematuria: Secondary | ICD-10-CM

## 2014-12-17 LAB — URINALYSIS, ROUTINE W REFLEX MICROSCOPIC
Bilirubin Urine: NEGATIVE
Glucose, UA: NEGATIVE mg/dL
KETONES UR: NEGATIVE mg/dL
Nitrite: NEGATIVE
PH: 5.5 (ref 5.0–8.0)
PROTEIN: NEGATIVE mg/dL
Specific Gravity, Urine: 1.007 (ref 1.005–1.030)
Urobilinogen, UA: 0.2 mg/dL (ref 0.0–1.0)

## 2014-12-17 LAB — CBC WITH DIFFERENTIAL/PLATELET
BASOS PCT: 0 % (ref 0–1)
Basophils Absolute: 0 10*3/uL (ref 0.0–0.1)
Eosinophils Absolute: 0 10*3/uL (ref 0.0–0.7)
Eosinophils Relative: 0 % (ref 0–5)
HEMATOCRIT: 29.1 % — AB (ref 36.0–46.0)
HEMOGLOBIN: 9.3 g/dL — AB (ref 12.0–15.0)
Lymphocytes Relative: 25 % (ref 12–46)
Lymphs Abs: 1.2 10*3/uL (ref 0.7–4.0)
MCH: 21.4 pg — ABNORMAL LOW (ref 26.0–34.0)
MCHC: 32 g/dL (ref 30.0–36.0)
MCV: 66.9 fL — ABNORMAL LOW (ref 78.0–100.0)
Monocytes Absolute: 0.9 10*3/uL (ref 0.1–1.0)
Monocytes Relative: 18 % — ABNORMAL HIGH (ref 3–12)
Neutro Abs: 2.8 10*3/uL (ref 1.7–7.7)
Neutrophils Relative %: 58 % (ref 43–77)
PLATELETS: 306 10*3/uL (ref 150–400)
RBC: 4.35 MIL/uL (ref 3.87–5.11)
RDW: 17.9 % — ABNORMAL HIGH (ref 11.5–15.5)
WBC: 4.8 10*3/uL (ref 4.0–10.5)

## 2014-12-17 LAB — BASIC METABOLIC PANEL
ANION GAP: 7 (ref 5–15)
BUN: 8 mg/dL (ref 6–23)
CALCIUM: 8.6 mg/dL (ref 8.4–10.5)
CHLORIDE: 103 mmol/L (ref 96–112)
CO2: 26 mmol/L (ref 19–32)
Creatinine, Ser: 1.14 mg/dL — ABNORMAL HIGH (ref 0.50–1.10)
GFR calc non Af Amer: 61 mL/min — ABNORMAL LOW (ref >90)
GFR, EST AFRICAN AMERICAN: 71 mL/min — AB (ref >90)
GLUCOSE: 110 mg/dL — AB (ref 70–99)
Potassium: 2.9 mmol/L — ABNORMAL LOW (ref 3.5–5.1)
Sodium: 136 mmol/L (ref 135–145)

## 2014-12-17 LAB — URINE MICROSCOPIC-ADD ON

## 2014-12-17 LAB — PREGNANCY, URINE: PREG TEST UR: NEGATIVE

## 2014-12-17 MED ORDER — ONDANSETRON HCL 4 MG/2ML IJ SOLN
4.0000 mg | Freq: Once | INTRAMUSCULAR | Status: AC
  Administered 2014-12-17: 4 mg via INTRAVENOUS
  Filled 2014-12-17: qty 2

## 2014-12-17 MED ORDER — KETOROLAC TROMETHAMINE 30 MG/ML IJ SOLN
30.0000 mg | Freq: Once | INTRAMUSCULAR | Status: AC
  Administered 2014-12-17: 30 mg via INTRAVENOUS
  Filled 2014-12-17: qty 1

## 2014-12-17 MED ORDER — SODIUM CHLORIDE 0.9 % IV BOLUS (SEPSIS)
1000.0000 mL | Freq: Once | INTRAVENOUS | Status: AC
  Administered 2014-12-17: 1000 mL via INTRAVENOUS

## 2014-12-17 MED ORDER — GI COCKTAIL ~~LOC~~
30.0000 mL | Freq: Once | ORAL | Status: AC
  Administered 2014-12-17: 30 mL via ORAL
  Filled 2014-12-17: qty 30

## 2014-12-17 MED ORDER — METOCLOPRAMIDE HCL 10 MG PO TABS: 10.0000 mg | ORAL_TABLET | Freq: Four times a day (QID) | ORAL | Status: DC | PRN

## 2014-12-17 NOTE — Discharge Instructions (Signed)

## 2014-12-17 NOTE — ED Provider Notes (Signed)
CSN: [id number]     Arrival date & time 12/16/14  2351 History  This chart was scribed for Mirian MoMatthew Leitha Hyppolite, MD by Gwenyth Oberatherine Macek, ED Scribe. This patient was seen in room MH05/MH05 and the patient's care was started at 12:46 AM.    Chief Complaint  Patient presents with  . Nausea   The history is provided by the patient. No language interpreter was used.    HPI Comments: Christina Armstrong is a 39 y.o. female who presents to the Emergency Department complaining of constant, gradually worsening nausea that started 2 days ago. She notes cough, vomiting, fatigue, constipation and decreased appetite as associated symptoms. Pt also states tachycardia, fever of 101.6, dysuria, chills and generalized body aches that occured 2 days ago and are currently resolved. [facility] where she was diagnosed with a kidney infection and given a prescription for Bactrim.  Since that time her symptoms have improved, until today when nausea began. Pt denies diarrhea as an associated symptom.  Past Medical History  Diagnosis Date  . Anxiety   . MVP (mitral valve prolapse)   . Panic attacks   . Asthma     seasonal bronchitis   Past Surgical History  Procedure Laterality Date  . Cesarean section    . Tubal ligation     Family History  Problem Relation Age of Onset  . Other Neg Hx    History  Substance Use Topics  . Smoking status: Never Smoker   . Smokeless tobacco: Not on file  . Alcohol Use: Yes     Comment: occasional alcohol   OB History    Gravida Para Term Preterm AB TAB SAB Ectopic Multiple Living   5 4 4  0 1 0 1 0 0 4     Review of Systems  Constitutional: Positive for appetite change and fatigue. Negative for fever and chills.  Respiratory: Positive for cough.   Gastrointestinal: Positive for nausea, vomiting and constipation.  Genitourinary: Negative for dysuria.  All other systems reviewed and are negative.  Allergies  Ciprofloxacin and Codeine  Home Medications    Prior to Admission medications   Medication Sig Start Date End Date Taking? Authorizing Provider  fluconazole (DIFLUCAN) 150 MG tablet Take 1 tablet (150 mg total) by mouth daily. 12/13/14   Debbrah AlarJennifer Irene Rasch, NP  metoCLOPramide (REGLAN) 10 MG tablet Take 1 tablet (10 mg total) by mouth every 6 (six) hours as needed for nausea or vomiting. 12/17/14   Mirian MoMatthew Petrina Melby, MD  Multiple Vitamin (MULTIVITAMIN WITH MINERALS) TABS Take 1 tablet by mouth every morning.    Historical Provider, MD  sulfamethoxazole-trimethoprim (BACTRIM DS,SEPTRA DS) 800-160 MG per tablet Take 1 tablet by mouth 2 (two) times daily. 12/13/14 12/20/14  Iona HansenJennifer Irene Rasch, NP  vitamin C (ASCORBIC ACID) 500 MG tablet Take 500 mg by mouth every morning.    Historical Provider, MD   BP 127/78 mmHg  Pulse 100  Temp(Src) 98.1 F (36.7 C) (Oral)  Resp 18  Ht 5' 1.5" (1.562 m)  Wt 222 lb (100.699 kg)  BMI 41.27 kg/m2  SpO2 100%  LMP 11/19/2014 (Approximate) Physical Exam  Constitutional: She is oriented to person, place, and time. She appears well-developed and well-nourished.  HENT:  Head: Normocephalic and atraumatic.  Right Ear: External ear normal.  Left Ear: External ear normal.  Eyes: Conjunctivae and EOM are normal. Pupils are equal, round, and reactive to light.  Neck: Normal range of motion. Neck supple.  Cardiovascular:  Normal rate, regular rhythm, normal heart sounds and intact distal pulses.   Pulmonary/Chest: Effort normal and breath sounds normal.  Abdominal: Soft. Bowel sounds are normal. There is no tenderness.  Musculoskeletal: Normal range of motion.  Neurological: She is alert and oriented to person, place, and time.  Skin: Skin is warm and dry.  Vitals reviewed.   ED Course  Procedures (including critical care time) DIAGNOSTIC STUDIES: Oxygen Saturation is 100% on RA, normal by my interpretation.    COORDINATION OF CARE: 12:55 AM Discussed treatment plan with pt at bedside and pt agreed to  plan.  Labs Review Labs Reviewed  URINALYSIS, ROUTINE W REFLEX MICROSCOPIC - Abnormal; Notable for the following:    APPearance CLOUDY (*)    Hgb urine dipstick SMALL (*)    Leukocytes, UA LARGE (*)    All other components within normal limits  CBC WITH DIFFERENTIAL/PLATELET - Abnormal; Notable for the following:    Hemoglobin 9.3 (*)    HCT 29.1 (*)    MCV 66.9 (*)    MCH 21.4 (*)    RDW 17.9 (*)    Monocytes Relative 18 (*)    All other components within normal limits  BASIC METABOLIC PANEL - Abnormal; Notable for the following:    Potassium 2.9 (*)    Glucose, Bld 110 (*)    Creatinine, Ser 1.14 (*)    GFR calc non Af Amer 61 (*)    GFR calc Af Amer 71 (*)    All other components within normal limits  URINE MICROSCOPIC-ADD ON - Abnormal; Notable for the following:    Squamous Epithelial / LPF FEW (*)    Bacteria, UA FEW (*)    All other components within normal limits  URINE CULTURE  PREGNANCY, URINE    Imaging Review Dg Chest Portable 1 View  12/17/2014   CLINICAL DATA:  Nausea and vomiting.  EXAM: PORTABLE CHEST - 1 VIEW  COMPARISON:  12/29/2013  FINDINGS: Mild hyperinflation bronchial thickening. The heart is at the upper limits of normal in size, likely accentuated by technique. Mediastinal contours are normal. Pulmonary vasculature is normal. No consolidation, pleural effusion, or pneumothorax. No acute osseous abnormalities are seen.  IMPRESSION: Mild hyperinflation and bronchial thickening, may reflect bronchitis or asthma.   Electronically Signed   By: Rubye Oaks M.D.   On: 12/17/2014 02:31     EKG Interpretation None      MDM   Final diagnoses:  Cystitis    39 y.o. female with pertinent PMH of recent UTI on bactrim presents with new nausea/vomiting.  On arrival vitals and physical exam as above.  Wu with continued bacteriuria, however culture from 2 days ago show that pt is sensitive to bactrim, so will continue this.  Also given prescription for  reglan ($4 list).  Pt took PO without difficulty.  DC home in stable condition.    I have reviewed all laboratory and imaging studies if ordered as above  1. Cystitis          Mirian Mo, MD 12/17/14 [phone]

## 2014-12-17 NOTE — ED Notes (Signed)
Pt. Reports she has had a UTI and has been place on abx. And now she has had vomiting with some visits to the ED due to nausea and vomiting and elevated HR.  Pt. Has vomited x 2 today and has nausea at present time.  Pt. Reports no nausea meds given with her abx. Scripts.

## 2014-12-19 LAB — URINE CULTURE: Colony Count: 15000

## 2014-12-20 ENCOUNTER — Telehealth (HOSPITAL_COMMUNITY): Payer: Self-pay

## 2014-12-20 NOTE — ED Notes (Signed)
Post ED Visit - Positive Culture Follow-up  Culture report reviewed by antimicrobial stewardship pharmacist: []  Wes Dulaney, Pharm.D., BCPS [x]  Celedonio MiyamotoJeremy Frens, Pharm.D., BCPS []  Georgina PillionElizabeth Martin, [address]Ph[address]D., BCPS []  Agency VillageMinh Pham, [place]arm.D., BCPS, AAHIVP []  Estella HuskMichelle Turner, Pharm.D., BCPS, AAHIVP []  Elder CyphersLorie Poole, [address]Pharm[address]D., BCPS  Positive urine culture  no further patient follow-up is required at this time.  Ashley JacobsFesterman, Lexys Milliner C 12/20/2014, 5:48 PM

## 2015-03-10 ENCOUNTER — Encounter: Payer: Self-pay | Admitting: *Deleted

## 2015-03-10 ENCOUNTER — Telehealth: Payer: Self-pay | Admitting: *Deleted

## 2015-03-10 DIAGNOSIS — N926 Irregular menstruation, unspecified: Secondary | ICD-10-CM

## 2015-03-10 NOTE — Telephone Encounter (Signed)
Ultrasound appointment May 4 @ 1:45.  Attempted to contact patient, no answer, left message for patient with ultrasound date/time with instructions.  Will send letter.

## 2015-03-19 ENCOUNTER — Ambulatory Visit (HOSPITAL_COMMUNITY): Payer: Medicaid Other

## 2015-04-17 ENCOUNTER — Emergency Department (HOSPITAL_COMMUNITY)
Admission: EM | Admit: 2015-04-17 | Discharge: 2015-04-18 | Disposition: A | Discharge status: Home / Self Care | Payer: Medicaid Other | Attending: Emergency Medicine | Admitting: Emergency Medicine

## 2015-04-17 ENCOUNTER — Encounter (HOSPITAL_COMMUNITY): Payer: Self-pay | Admitting: Emergency Medicine

## 2015-04-17 DIAGNOSIS — Z79899 Other long term (current) drug therapy: Secondary | ICD-10-CM | POA: Insufficient documentation

## 2015-04-17 DIAGNOSIS — R079 Chest pain, unspecified: Secondary | ICD-10-CM | POA: Insufficient documentation

## 2015-04-17 DIAGNOSIS — Z8679 Personal history of other diseases of the circulatory system: Secondary | ICD-10-CM | POA: Insufficient documentation

## 2015-04-17 DIAGNOSIS — F410 Panic disorder [episodic paroxysmal anxiety] without agoraphobia: Secondary | ICD-10-CM | POA: Diagnosis not present

## 2015-04-17 DIAGNOSIS — E663 Overweight: Secondary | ICD-10-CM | POA: Insufficient documentation

## 2015-04-17 DIAGNOSIS — Z3202 Encounter for pregnancy test, result negative: Secondary | ICD-10-CM | POA: Insufficient documentation

## 2015-04-17 DIAGNOSIS — J45909 Unspecified asthma, uncomplicated: Secondary | ICD-10-CM | POA: Insufficient documentation

## 2015-04-17 DIAGNOSIS — R911 Solitary pulmonary nodule: Secondary | ICD-10-CM

## 2015-04-17 DIAGNOSIS — J984 Other disorders of lung: Secondary | ICD-10-CM | POA: Insufficient documentation

## 2015-04-17 LAB — CBC
HEMATOCRIT: 32.5 % — AB (ref 36.0–46.0)
HEMOGLOBIN: 9.9 g/dL — AB (ref 12.0–15.0)
MCH: 21.3 pg — ABNORMAL LOW (ref 26.0–34.0)
MCHC: 30.5 g/dL (ref 30.0–36.0)
MCV: 69.9 fL — AB (ref 78.0–100.0)
Platelets: 296 10*3/uL (ref 150–400)
RBC: 4.65 MIL/uL (ref 3.87–5.11)
RDW: 17.5 % — ABNORMAL HIGH (ref 11.5–15.5)
WBC: 6.5 10*3/uL (ref 4.0–10.5)

## 2015-04-17 LAB — I-STAT TROPONIN, ED: Troponin i, poc: 0 ng/mL (ref 0.00–0.08)

## 2015-04-17 NOTE — ED Notes (Signed)
Pt states she has not felt right most of the day  Pt states earlier today she felt some numbness and tingling around her mouth and later she started having chest pain and dizziness  Pt states she keeps having to take deep breaths because she feels like she cannot get enough air in   Pt states her blood pressure is elevated tonight

## 2015-04-18 ENCOUNTER — Emergency Department (HOSPITAL_COMMUNITY): Payer: Medicaid Other

## 2015-04-18 ENCOUNTER — Encounter (HOSPITAL_COMMUNITY): Payer: Self-pay

## 2015-04-18 LAB — BASIC METABOLIC PANEL
Anion gap: 8 (ref 5–15)
BUN: 7 mg/dL (ref 6–20)
CHLORIDE: 105 mmol/L (ref 101–111)
CO2: 23 mmol/L (ref 22–32)
Calcium: 9 mg/dL (ref 8.9–10.3)
Creatinine, Ser: 0.62 mg/dL (ref 0.44–1.00)
GFR calc Af Amer: >60 mL/min (ref >60)
Glucose, Bld: 98 mg/dL (ref 65–99)
POTASSIUM: 3.4 mmol/L — AB (ref 3.5–5.1)
SODIUM: 136 mmol/L (ref 135–145)

## 2015-04-18 LAB — D-DIMER, QUANTITATIVE: D-Dimer, Quant: 1.72 ug/mL-FEU — ABNORMAL HIGH (ref 0.00–0.48)

## 2015-04-18 LAB — POC URINE PREG, ED: Preg Test, Ur: NEGATIVE

## 2015-04-18 MED ORDER — IOHEXOL 350 MG/ML SOLN
100.0000 mL | Freq: Once | INTRAVENOUS | Status: AC | PRN
  Administered 2015-04-18: 100 mL via INTRAVENOUS

## 2015-04-18 NOTE — ED Notes (Signed)
Patient ok for ice chips

## 2015-04-18 NOTE — ED Provider Notes (Signed)
CSN: [id number]     Arrival date & time 04/17/15  2224 History   First MD Initiated Contact with Patient 04/17/15 2356     Chief Complaint  Patient presents with  . Chest Pain     (Consider location/radiation/quality/duration/timing/severity/associated sxs/prior Treatment) HPI  This a 39 year old female who presents with chest pain, palpitations, lightheadedness, and perioral numbness. Patient reports onset of symptoms today with worsening of symptoms at approximate 5 PM. She states that she was at work and was feeling anxious and tired. Symptoms got worse that she was driving home" the mom soon." She reports chest heaviness and fluttering. Currently chest pain-free. Denies any shortness of breath. Reports feeling "jittery." Also reports perioral numbness and tingling with lightheadedness.  Denies headache. History of similar symptoms in the past but have not been this bad. History of anxiety. Has not taken any medication today. Patient states that her blood pressure was high when she came in and this is abnormal for her.  Past Medical History  Diagnosis Date  . Anxiety   . MVP (mitral valve prolapse)   . Panic attacks   . Asthma     seasonal bronchitis   Past Surgical History  Procedure Laterality Date  . Cesarean section    . Tubal ligation     Family History  Problem Relation Age of Onset  . Other Neg Hx    History  Substance Use Topics  . Smoking status: Never Smoker   . Smokeless tobacco: Not on file  . Alcohol Use: Yes     Comment: occasional alcohol   OB History    Gravida Para Term Preterm AB TAB SAB Ectopic Multiple Living   0 1 0 1 0 0 4     Review of Systems  Constitutional: Negative for fever.  Respiratory: Positive for chest tightness. Negative for cough and shortness of breath.   Cardiovascular: Negative for chest pain.  Gastrointestinal: Negative for nausea, vomiting and abdominal pain.  Genitourinary: Negative for dysuria.  Neurological: Positive  for light-headedness. Negative for dizziness, weakness and headaches.       Perioral numbness  Psychiatric/Behavioral: Negative for confusion. The patient is nervous/anxious.   All other systems reviewed and are negative.     Allergies  Ciprofloxacin and Codeine  Home Medications   Prior to Admission medications   Medication Sig Start Date End Date Taking? Authorizing Provider  GARLIC PO Take 2 tablets by mouth daily.   Yes Historical Provider, MD  Multiple Vitamin (MULTIVITAMIN WITH MINERALS) TABS Take 1 tablet by mouth every morning.   Yes Historical Provider, MD  fluconazole (DIFLUCAN) 150 MG tablet Take 1 tablet (150 mg total) by mouth daily. Patient not taking: Reported on 04/17/2015 12/13/14   Duane Lope, NP  metoCLOPramide (REGLAN) 10 MG tablet Take 1 tablet (10 mg total) by mouth every 6 (six) hours as needed for nausea or vomiting. Patient not taking: Reported on 04/17/2015 12/17/14   Mirian Mo, MD   BP 126/74 mmHg  Pulse 72  Temp(Src) 97.5 F (36.4 C) (Oral)  Resp 21  SpO2 99%  LMP  Physical Exam  Constitutional: She is oriented to person, place, and time. She appears well-developed and well-nourished.  Overweight  HENT:  Head: Normocephalic and atraumatic.  Eyes: Pupils are equal, round, and reactive to light.  Cardiovascular: Normal rate, regular rhythm and normal heart sounds.   No murmur heard. Pulmonary/Chest: Effort normal and breath sounds normal. No respiratory distress. She has no wheezes.  She exhibits no tenderness.  Abdominal: Soft. Bowel sounds are normal. There is no tenderness. There is no rebound.  Musculoskeletal: She exhibits no edema.  Neurological: She is alert and oriented to person, place, and time.  Skin: Skin is warm and dry.  Psychiatric: She has a normal mood and affect.  Nursing note and vitals reviewed.   ED Course  Procedures (including critical care time) Labs Review Labs Reviewed  CBC - Abnormal; Notable for the following:     Hemoglobin 9.9 (*)    HCT 32.5 (*)    MCV 69.9 (*)    MCH 21.3 (*)    RDW 17.5 (*)    All other components within normal limits  BASIC METABOLIC PANEL - Abnormal; Notable for the following:    Potassium 3.4 (*)    All other components within normal limits  D-DIMER, QUANTITATIVE (NOT AT [facility]) - Abnormal; Notable for the following:    D-Dimer, Quant 1.72 (*)    All other components within normal limits  I-STAT TROPOININ, ED  POC URINE PREG, ED    Imaging Review Dg Chest 2 View  04/18/2015   CLINICAL DATA:  Central chest tightness, dizziness and shortness of breath. Symptoms for 1 day.  EXAM: CHEST  2 VIEW  COMPARISON:  12/17/2014  FINDINGS: The cardiomediastinal contours are normal. Decreased hyperinflation and bronchial thickening from prior. Pulmonary vasculature is normal. No consolidation, pleural effusion, or pneumothorax. No acute osseous abnormalities are seen.  IMPRESSION: No acute pulmonary process.   Electronically Signed   By: Rubye Oaks M.D.   On: 04/18/2015 01:10   Ct Angio Chest Pe W/cm &/or Wo Cm  04/18/2015   CLINICAL DATA:  Initial evaluation for acute chest tightness, shortness of breath, dizziness for 1 day.  EXAM: CT ANGIOGRAPHY CHEST WITH CONTRAST  TECHNIQUE: Multidetector CT imaging of the chest was performed using the standard protocol during bolus administration of intravenous contrast. Multiplanar CT image reconstructions and MIPs were obtained to evaluate the vascular anatomy.  CONTRAST:  OMNIPAQUE IOHEXOL 350 MG/ML SOLN  COMPARISON:  Prior radiograph from earlier the same day.  FINDINGS: Thyroid gland within normal limits. No pathologically enlarged mediastinal, hilar, or axillary lymph nodes identified.  Intrathoracic aorta is of normal caliber and appearance. No evidence for aneurysm or acute aortic pathology. Heart size is normal. No pericardial effusion.  Pulmonary arterial tree is well opacified. No filling defect to suggest acute pulmonary embolism.  Re-formatted imaging confirms these findings.  Visualized lungs are clear. No focal infiltrate identified. No pulmonary edema or pleural effusion. Tiny 3 mm right upper lobe nodule noted (series 6, image 30). No other worrisome pulmonary nodule or mass.  Visualized portions of the upper abdomen are unremarkable.  No acute osseous abnormality. No worrisome lytic or blastic osseous lesions.  IMPRESSION: 1. No CT evidence for acute pulmonary embolism. 2. No other acute cardiopulmonary abnormality identified. 3. 3 mm right upper lobe pulmonary nodule, indeterminate. If the patient is at high risk for bronchogenic carcinoma, follow-up chest CT at 1 year is recommended. If the patient is at low risk, no follow-up is needed. This recommendation follows the consensus statement: Guidelines for Management of Small Pulmonary Nodules Detected on CT Scans: A Statement from the Fleischner Society as published in Radiology 2005; 237:395-400.   Electronically Signed   By: Rise Mu M.D.   On: 04/18/2015 02:57     EKG Interpretation   Date/Time:  Thursday April 17 2015 22:38:19 EDT Ventricular Rate:  77 PR  Interval:  156 QRS Duration: 86 QT Interval:  371 QTC Calculation: 420 R Axis:   43 Text Interpretation:  Sinus rhythm Ventricular premature complex Baseline  wander in lead(s) II III aVF V1 V2 since last tracing no significant  change Confirmed by MILLER  MD, BRIAN ([id number]) on 04/17/2015 10:44:16 PM      MDM   Final diagnoses:  Panic attack  Chest pain, unspecified chest pain type  Lung nodule    Patient presents with chest pain, dizziness, and perioral numbness in the setting of anxiety. History of the same. She is nontoxic on exam. She is tachycardic. Otherwise low risk for blood clot. Low risk for ACS as well. EKG is reassuring. Chest x-ray is negative. D-dimer is elevated. CTA obtained and without evidence of PE. Does show a lung nodule. Patient is low risk and a nonsmoker. Discussed workup  and suspicions that her symptoms were likely related to panic attack. Patient stated understanding. Will follow-up with her primary physician.  After history, exam, and medical workup I feel the patient has been appropriately medically screened and is safe for discharge home. Pertinent diagnoses were discussed with the patient. Patient was given return precautions.     Shon Batonourtney F Zarielle Cea, MD 04/18/15 [phone]

## 2015-04-18 NOTE — ED Notes (Signed)
Patient returned from XR. 

## 2015-04-18 NOTE — ED Notes (Signed)
Patient states throughout the day she has been having central chest tightness, dizziness, and shortness of breath. Patient is able to speak in complete sentences, NAD noted. Patient A&Ox4. Patient reports a history of chest discomfort in the past, describes it as different than this, states her heart feels like it is fluttering.

## 2015-04-18 NOTE — ED Notes (Signed)
MD at bedside. 

## 2015-04-18 NOTE — ED Notes (Signed)
Patient transported to CT 

## 2015-04-18 NOTE — ED Notes (Signed)
Family at bedside. 

## 2015-04-18 NOTE — Discharge Instructions (Signed)
You were seen today for chest pain. Your workup is reassuring. Your chest pain is likely related to anxiety and panic attack. On her CT scan, you were noted to have a small lung nodule. Because you are a nonsmoker, you do not need follow-up imaging.  Panic Attacks Panic attacks are sudden, short-livedsurges of severe anxiety, fear, or discomfort. They may occur for no reason when you are relaxed, when you are anxious, or when you are sleeping. Panic attacks may occur for a number of reasons:   Healthy people occasionally have panic attacks in extreme, life-threatening situations, such as war or natural disasters. Normal anxiety is a protective mechanism of the body that helps us react to danger (fight or flight response).  Panic attacks are often seen with anxiety disorders, such as panic disorder, social anxiety disorder, generalized anxiety disorder, and phobias. Anxiety disorders cause excessive or uncontrollable anxiety. They may interfere with your relationships or other life activities.  Panic attacks are sometimes seen with other mental illnesses, such as depression and posttraumatic stress disorder.  Certain medical conditions, prescription medicines, and drugs of abuse can cause panic attacks. SYMPTOMS  Panic attacks start suddenly, peak within 20 minutes, and are accompanied by four or more of the following symptoms:  Pounding heart or fast heart rate (palpitations).  Sweating.  Trembling or shaking.  Shortness of breath or feeling smothered.  Feeling choked.  Chest pain or discomfort.  Nausea or strange feeling in your stomach.  Dizziness, light-headedness, or feeling like you will faint.  Chills or hot flushes.  Numbness or tingling in your lips or hands and feet.  Feeling that things are not real or feeling that you are not yourself.  Fear of losing control or going crazy.  Fear of dying. Some of these symptoms can mimic serious medical conditions. For example,  you may think you are having a heart attack. Although panic attacks can be very scary, they are not life threatening. DIAGNOSIS  Panic attacks are diagnosed through an assessment by your health care provider. Your health care provider will ask questions about your symptoms, such as where and when they occurred. Your health care provider will also ask about your medical history and use of alcohol and drugs, including prescription medicines. Your health care provider may order blood tests or other studies to rule out a serious medical condition. Your health care provider may refer you to a mental health professional for further evaluation. TREATMENT   Most healthy people who have one or two panic attacks in an extreme, life-threatening situation will not require treatment.  The treatment for panic attacks associated with anxiety disorders or other mental illness typically involves counseling with a mental health professional, medicine, or a combination of both. Your health care provider will help determine what treatment is best for you.  Panic attacks due to physical illness usually go away with treatment of the illness. If prescription medicine is causing panic attacks, talk with your health care provider about stopping the medicine, decreasing the dose, or substituting another medicine.  Panic attacks due to alcohol or drug abuse go away with abstinence. Some adults need professional help in order to stop drinking or using drugs. HOME CARE INSTRUCTIONS   Take all medicines as directed by your health care provider.   Schedule and attend follow-up visits as directed by your health care provider. It is important to keep all your appointments. SEEK MEDICAL CARE IF:  You are not able to take your medicines as prescribed.  Your symptoms do not improve or get worse. SEEK IMMEDIATE MEDICAL CARE IF:   You experience panic attack symptoms that are different than your usual symptoms.  You have  serious thoughts about hurting yourself or others.  You are taking medicine for panic attacks and have a serious side effect. MAKE SURE YOU:  Understand these instructions.  Will watch your condition.  Will get help right away if you are not doing well or get worse. Document Released: 11/01/2005 Document Revised: 11/06/2013 Document Reviewed: 06/15/2013 [facility] Patient Information 2015 [place], [place]. This information is not intended to replace advice given to you by your health care provider. Make sure you discuss any questions you have with your health care provider.

## 2015-04-18 NOTE — ED Notes (Signed)
Per lab, blood in lab for D-dimer

## 2015-12-17 DIAGNOSIS — Z8659 Personal history of other mental and behavioral disorders: Secondary | ICD-10-CM | POA: Insufficient documentation

## 2015-12-17 DIAGNOSIS — Z3202 Encounter for pregnancy test, result negative: Secondary | ICD-10-CM | POA: Diagnosis not present

## 2015-12-17 DIAGNOSIS — Z9889 Other specified postprocedural states: Secondary | ICD-10-CM | POA: Diagnosis not present

## 2015-12-17 DIAGNOSIS — J45909 Unspecified asthma, uncomplicated: Secondary | ICD-10-CM | POA: Diagnosis not present

## 2015-12-17 DIAGNOSIS — X58XXXA Exposure to other specified factors, initial encounter: Secondary | ICD-10-CM | POA: Diagnosis not present

## 2015-12-17 DIAGNOSIS — Z8679 Personal history of other diseases of the circulatory system: Secondary | ICD-10-CM | POA: Diagnosis not present

## 2015-12-17 DIAGNOSIS — Z9851 Tubal ligation status: Secondary | ICD-10-CM | POA: Insufficient documentation

## 2015-12-17 DIAGNOSIS — Y998 Other external cause status: Secondary | ICD-10-CM | POA: Diagnosis not present

## 2015-12-17 DIAGNOSIS — Y9289 Other specified places as the place of occurrence of the external cause: Secondary | ICD-10-CM | POA: Diagnosis not present

## 2015-12-17 DIAGNOSIS — Y9389 Activity, other specified: Secondary | ICD-10-CM | POA: Insufficient documentation

## 2015-12-17 DIAGNOSIS — R300 Dysuria: Secondary | ICD-10-CM | POA: Insufficient documentation

## 2015-12-17 DIAGNOSIS — T192XXA Foreign body in vulva and vagina, initial encounter: Secondary | ICD-10-CM | POA: Diagnosis not present

## 2015-12-17 NOTE — ED Notes (Signed)
Urine frequency and urgency for the last week with lower back pain and subjective fever

## 2015-12-18 ENCOUNTER — Encounter (HOSPITAL_BASED_OUTPATIENT_CLINIC_OR_DEPARTMENT_OTHER): Payer: Self-pay

## 2015-12-18 ENCOUNTER — Emergency Department (HOSPITAL_BASED_OUTPATIENT_CLINIC_OR_DEPARTMENT_OTHER)
Admission: EM | Admit: 2015-12-18 | Discharge: 2015-12-18 | Disposition: A | Discharge status: Home / Self Care | Payer: Medicaid Other | Attending: Emergency Medicine | Admitting: Emergency Medicine

## 2015-12-18 DIAGNOSIS — T192XXA Foreign body in vulva and vagina, initial encounter: Secondary | ICD-10-CM

## 2015-12-18 DIAGNOSIS — R300 Dysuria: Secondary | ICD-10-CM

## 2015-12-18 LAB — WET PREP, GENITAL
Sperm: NONE SEEN
Trich, Wet Prep: NONE SEEN
YEAST WET PREP: NONE SEEN

## 2015-12-18 LAB — URINALYSIS, ROUTINE W REFLEX MICROSCOPIC
Bilirubin Urine: NEGATIVE
Glucose, UA: NEGATIVE mg/dL
Hgb urine dipstick: NEGATIVE
KETONES UR: NEGATIVE mg/dL
Leukocytes, UA: NEGATIVE
Nitrite: NEGATIVE
PROTEIN: NEGATIVE mg/dL
Specific Gravity, Urine: 1.009 (ref 1.005–1.030)
pH: 5.5 (ref 5.0–8.0)

## 2015-12-18 LAB — PREGNANCY, URINE: Preg Test, Ur: NEGATIVE

## 2015-12-18 NOTE — ED Notes (Signed)
C/o urinary freq and urgency x 1 week  Lower back and abd pain  denies vag dc  Pain comes and goes

## 2015-12-18 NOTE — Discharge Instructions (Signed)
Return to the emergency department if you develop any worsening or concerning symptoms.   Vaginal Foreign Body A vaginal foreign body is any object that gets stuck or left inside the vagina. Vaginal foreign bodies left in the vagina for a Pulsifer time can cause irritation and infection. In most cases, symptoms go away once the vaginal foreign body is found and removed. Rarely, a foreign object can break through the walls of the vagina and cause a serious infection inside the abdomen.  CAUSES  The most common vaginal foreign bodies are:  Tampons.  Contraceptive devices.  Toilet tissue left in the vagina.  Small objects that were placed in the vagina out of curiosity and got stuck.  A result of sexual abuse. SIGNS AND SYMPTOMS  Light vaginal bleeding.  Blood-tinged vaginal fluid (discharge).  Vaginal discharge that smells bad.  Vaginal itching or burning.  Redness, swelling, or rash near the opening of the vagina.  Abdominal pain.  Fever.  Burning or frequent urination. DIAGNOSIS  Your health care provider may be able to diagnose a vaginal foreign body based on the information you provide, your symptoms, and a physical exam. Your health care provider may also perform the following tests to check for infection:  A swab of the discharge to check under a microscope for bacteria (culture).  A urine culture.  An examination of the vagina with a small, lighted scope (vaginoscopy).  Imaging tests to get a picture of the inside of your vagina, such as:  Ultrasound.  X-ray.  MRI. TREATMENT  In most cases, a vaginal foreign body can be easily removed and the symptoms usually go away very quickly. Other treatment may include:   If the vaginal foreign body is not easily removed, medicine may be given to make you go to sleep (general anesthesia) to have the object removed.  Emergency surgery may be necessary if an infection spreads through the walls of the vagina into the  abdomen (acute abdomen). This is rare.  You may need to take antibiotic medicine if you have a vaginal or urinary tract infection. HOME CARE INSTRUCTIONS   Take medicines only as directed by your health care provider.  If you were prescribed an antibiotic medicine, finish it all even if you start to feel better.  Do not have sex or use tampons until your health care provider says it is okay.  Do not douche or use vaginal rinses unless your health care provider recommends it.  Keep all follow-up visits as directed by your health care provider. This is important. SEEK MEDICAL CARE IF:  You have abdominal pain or burning pain when urinating.  You have a fever. SEEK IMMEDIATE MEDICAL CARE IF:  You have heavy vaginal bleeding or discharge.   You have very bad abdominal pain.  MAKE SURE YOU:  Understand these instructions.  Will watch your condition.  Will get help right away if you are not doing well or get worse.   This information is not intended to replace advice given to you by your health care provider. Make sure you discuss any questions you have with your health care provider.   Document Released: 03/18/2014 Document Reviewed: 03/18/2014 Elsevier Interactive Patient Education [employer]

## 2015-12-18 NOTE — ED Provider Notes (Signed)
CSN: [id number]     Arrival date & time 12/17/15  2334 History   First MD Initiated Contact with Patient 12/18/15 0022     Chief Complaint  Patient presents with  . Dysuria     (Consider location/radiation/quality/duration/timing/severity/associated sxs/prior Treatment) HPI Comments: Patient is a 40 year old female with history of renal calculus and prior pyelonephritis. She presents for evaluation of lower back pain and urinary urgency and frequency for the past several days. She denies fevers or chills. She denies nausea, vomiting, or diarrhea. She denies any vaginal discharge. She reports her last period was 2 weeks ago and was normal. She is sexually active with her significant other and denies any new sexual partners.  Patient is a 40 y.o. female presenting with dysuria. The history is provided by the patient.  Dysuria Pain quality:  Burning Pain severity:  Moderate Onset quality:  Gradual Duration:  3 days Timing:  Constant Progression:  Worsening Chronicity:  New Recent urinary tract infections: no   Relieved by:  Nothing Worsened by:  Nothing tried Ineffective treatments:  Phenazopyridine   Past Medical History  Diagnosis Date  . Anxiety   . MVP (mitral valve prolapse)   . Panic attacks   . Asthma     seasonal bronchitis   Past Surgical History  Procedure Laterality Date  . Cesarean section    . Tubal ligation     Family History  Problem Relation Age of Onset  . Other Neg Hx    Social History  Substance Use Topics  . Smoking status: Never Smoker   . Smokeless tobacco: None  . Alcohol Use: Yes     Comment: occasional alcohol   OB History    Gravida Para Term Preterm AB TAB SAB Ectopic Multiple Living   0 1 0 1 0 0 4     Review of Systems  Genitourinary: Positive for dysuria.  All other systems reviewed and are negative.     Allergies  Ciprofloxacin and Codeine  Home Medications   Prior to Admission medications   Medication Sig Start  Date End Date Taking? Authorizing Provider  fluconazole (DIFLUCAN) 150 MG tablet Take 1 tablet (150 mg total) by mouth daily. Patient not taking: Reported on 04/17/2015 12/13/14   Duane Lope, NP  metoCLOPramide (REGLAN) 10 MG tablet Take 1 tablet (10 mg total) by mouth every 6 (six) hours as needed for nausea or vomiting. Patient not taking: Reported on 04/17/2015 12/17/14   Mirian Mo, MD   BP 137/101 mmHg  Pulse 80  Temp(Src) 99 F (37.2 C) (Oral)  Resp 20  Ht  (1.575 m)  Wt 215 lb (97.523 kg)  BMI 39.31 kg/m2  SpO2 98%  LMP 12/01/2015 Physical Exam  Constitutional: She is oriented to person, place, and time. She appears well-developed and well-nourished. No distress.  HENT:  Head: Normocephalic and atraumatic.  Neck: Normal range of motion. Neck supple.  Cardiovascular: Normal rate and regular rhythm.  Exam reveals no gallop and no friction rub.   No murmur heard. Pulmonary/Chest: Effort normal and breath sounds normal. No respiratory distress. She has no wheezes.  Abdominal: Soft. Bowel sounds are normal. She exhibits no distension. There is no tenderness.  Genitourinary: Vagina normal. No vaginal discharge found.  There is a retained tampon noted.  Musculoskeletal: Normal range of motion.  Neurological: She is alert and oriented to person, place, and time.  Skin: Skin is warm and dry. She is not diaphoretic.  Nursing note  and vitals reviewed.   ED Course  Procedures (including critical care time) Labs Review Labs Reviewed  WET PREP, GENITAL  URINALYSIS, ROUTINE W REFLEX MICROSCOPIC (NOT AT [facility])  PREGNANCY, URINE  GC/CHLAMYDIA PROBE AMP ([place]) NOT AT [facility]    Imaging Review No results found. I have personally reviewed and evaluated these images and lab results as part of my medical decision-making.    MDM   Final diagnoses:  None    Retained tampon removed with ring forceps. Urinalysis and remainder of workup otherwise unremarkable. To return  as needed for any problems.    Geoffery Lyons, MD 12/18/15 [phone]

## 2015-12-19 LAB — GC/CHLAMYDIA PROBE AMP (~~LOC~~) NOT AT ARMC
CHLAMYDIA, DNA PROBE: NEGATIVE
Neisseria Gonorrhea: NEGATIVE

## 2016-05-15 ENCOUNTER — Emergency Department (HOSPITAL_COMMUNITY): Payer: Medicaid Other

## 2016-05-15 ENCOUNTER — Emergency Department (HOSPITAL_COMMUNITY)
Admission: EM | Admit: 2016-05-15 | Discharge: 2016-05-15 | Disposition: A | Discharge status: Home / Self Care | Payer: Medicaid Other | Attending: Emergency Medicine | Admitting: Emergency Medicine

## 2016-05-15 ENCOUNTER — Encounter (HOSPITAL_COMMUNITY): Payer: Self-pay | Admitting: Emergency Medicine

## 2016-05-15 DIAGNOSIS — R0600 Dyspnea, unspecified: Secondary | ICD-10-CM | POA: Diagnosis present

## 2016-05-15 DIAGNOSIS — D649 Anemia, unspecified: Secondary | ICD-10-CM | POA: Diagnosis not present

## 2016-05-15 DIAGNOSIS — Z791 Long term (current) use of non-steroidal anti-inflammatories (NSAID): Secondary | ICD-10-CM | POA: Diagnosis not present

## 2016-05-15 DIAGNOSIS — Z8679 Personal history of other diseases of the circulatory system: Secondary | ICD-10-CM | POA: Insufficient documentation

## 2016-05-15 DIAGNOSIS — I493 Ventricular premature depolarization: Secondary | ICD-10-CM

## 2016-05-15 DIAGNOSIS — Z79899 Other long term (current) drug therapy: Secondary | ICD-10-CM | POA: Diagnosis not present

## 2016-05-15 DIAGNOSIS — J45909 Unspecified asthma, uncomplicated: Secondary | ICD-10-CM | POA: Insufficient documentation

## 2016-05-15 DIAGNOSIS — R0789 Other chest pain: Secondary | ICD-10-CM | POA: Insufficient documentation

## 2016-05-15 LAB — BASIC METABOLIC PANEL
Anion gap: 5 (ref 5–15)
BUN: 7 mg/dL (ref 6–20)
CALCIUM: 9 mg/dL (ref 8.9–10.3)
CO2: 28 mmol/L (ref 22–32)
Chloride: 106 mmol/L (ref 101–111)
Creatinine, Ser: 0.71 mg/dL (ref 0.44–1.00)
GFR calc non Af Amer: >60 mL/min (ref >60)
Glucose, Bld: 97 mg/dL (ref 65–99)
Potassium: 3 mmol/L — ABNORMAL LOW (ref 3.5–5.1)
SODIUM: 139 mmol/L (ref 135–145)

## 2016-05-15 LAB — CBC
HCT: 28 % — ABNORMAL LOW (ref 36.0–46.0)
Hemoglobin: 8.6 g/dL — ABNORMAL LOW (ref 12.0–15.0)
MCH: 19.4 pg — AB (ref 26.0–34.0)
MCHC: 30.7 g/dL (ref 30.0–36.0)
MCV: 63.1 fL — ABNORMAL LOW (ref 78.0–100.0)
PLATELETS: 516 10*3/uL — AB (ref 150–400)
RBC: 4.44 MIL/uL (ref 3.87–5.11)
RDW: 18.2 % — ABNORMAL HIGH (ref 11.5–15.5)
WBC: 6 10*3/uL (ref 4.0–10.5)

## 2016-05-15 LAB — I-STAT TROPONIN, ED: TROPONIN I, POC: 0 ng/mL (ref 0.00–0.08)

## 2016-05-15 MED ORDER — FERROUS SULFATE 325 (65 FE) MG PO TABS: 325.0000 mg | ORAL_TABLET | Freq: Two times a day (BID) | ORAL | Status: DC

## 2016-05-15 MED ORDER — POTASSIUM CHLORIDE CRYS ER 20 MEQ PO TBCR
40.0000 meq | EXTENDED_RELEASE_TABLET | Freq: Once | ORAL | Status: AC
  Administered 2016-05-15: 40 meq via ORAL
  Filled 2016-05-15: qty 2

## 2016-05-15 NOTE — ED Provider Notes (Signed)
CSN: [id number]     Arrival date & time 05/15/16  0059 History  By signing my name below, I, Vista Minkobert Ross, attest that this documentation has been prepared under the direction and in the presence of Rolland PorterMark Marieliz Strang, MD. Electronically signed, Vista Minkobert Ross, ED Scribe. 05/15/2016. 3:18 AM.   Chief Complaint  Patient presents with  . Chest Pain  . Anxiety   Patient is a 40 y.o. female presenting with anxiety. The history is provided by the patient. No language interpreter was used.  Anxiety Associated symptoms include shortness of breath. Pertinent negatives include no chest pain, no abdominal pain and no headaches.   HPI Comments: Rogene HoustonGemia M Ferber is a 40 y.o. female with a PMHx of Anxiety, MVP, who presents to the Emergency Department complaining of sudden onset chest discomfort that started one hour ago. Pt describes it as someone sitting on her chest. Pt also reports associated sudden onset lightheadedness. Pt also states she has had intermittent shortness of breath for the past three days. Pt also reports associated heart palpitations that she states are very common for her. Pt denies a Hx of similar chest discomfort. Pt states she has a Hx of Acid Reflux but states her discomfort feels different. Pt states she has a FHx of DM but denies any Hx of heart problems. Pt states she is a frequent coffee drinker. Pt denies hyperventilating during the onset of chest discomfort.    Past Medical History  Diagnosis Date  . Anxiety   . MVP (mitral valve prolapse)   . Panic attacks   . Asthma     seasonal bronchitis   Past Surgical History  Procedure Laterality Date  . Cesarean section    . Tubal ligation     Family History  Problem Relation Age of Onset  . Other Neg Hx    Social History  Substance Use Topics  . Smoking status: Never Smoker   . Smokeless tobacco: None  . Alcohol Use: Yes     Comment: occasional alcohol   OB History    Gravida Para Term Preterm AB TAB SAB Ectopic Multiple Living   5  4 4  0 1 0 1 0 0 4     Review of Systems  Constitutional: Negative for fever, chills, diaphoresis, appetite change and fatigue.  HENT: Negative for mouth sores, sore throat and trouble swallowing.   Eyes: Negative for visual disturbance.  Respiratory: Positive for chest tightness and shortness of breath. Negative for cough and wheezing.   Cardiovascular: Negative for chest pain.  Gastrointestinal: Negative for nausea, vomiting, abdominal pain, diarrhea and abdominal distention.  Endocrine: Negative for polydipsia, polyphagia and polyuria.  Genitourinary: Negative for dysuria, frequency and hematuria.  Musculoskeletal: Negative for gait problem.  Skin: Negative for color change, pallor and rash.  Neurological: Positive for light-headedness. Negative for dizziness, syncope and headaches.  Hematological: Does not bruise/bleed easily.  Psychiatric/Behavioral: Negative for behavioral problems and confusion.  All other systems reviewed and are negative.     Allergies  Ciprofloxacin and Codeine  Home Medications   Prior to Admission medications   Medication Sig Start Date End Date Taking? Authorizing Provider  hydroxypropyl methylcellulose / hypromellose (ISOPTO TEARS / GONIOVISC) 2.5 % ophthalmic solution Place 1 drop into both eyes 3 (three) times daily as needed for dry eyes.   Yes Historical Provider, MD  ibuprofen (ADVIL,MOTRIN) 200 MG tablet Take 400 mg by mouth every 6 (six) hours as needed (for pain.).   Yes Historical Provider, MD  Multiple  Vitamin (MULTIVITAMIN WITH MINERALS) TABS tablet Take 1 tablet by mouth daily.   Yes Historical Provider, MD  ferrous sulfate 325 (65 FE) MG tablet Take 1 tablet (325 mg total) by mouth 2 (two) times daily with a meal. 05/15/16   Rolland Porter, MD   BP 132/84 mmHg  Pulse 73  Temp(Src) 98.1 F (36.7 C) (Oral)  Resp 16  Ht  (1.549 m)  Wt 209 lb (94.802 kg)  BMI 39.51 kg/m2  SpO2 100%  LMP 05/08/2016 Physical Exam  Constitutional: She is  oriented to person, place, and time. She appears well-developed and well-nourished. No distress.  HENT:  Head: Normocephalic.  Eyes: Conjunctivae are normal. Pupils are equal, round, and reactive to light. No scleral icterus.  Neck: Normal range of motion. Neck supple. No thyromegaly present.  Cardiovascular: Normal rate and regular rhythm.  Exam reveals no gallop and no friction rub.   No murmur heard. Pulmonary/Chest: Effort normal and breath sounds normal. No respiratory distress. She has no wheezes. She has no rales.  Abdominal: Soft. Bowel sounds are normal. She exhibits no distension. There is no tenderness. There is no rebound.  Musculoskeletal: Normal range of motion.  Neurological: She is alert and oriented to person, place, and time.  Skin: Skin is warm and dry. No rash noted.  Psychiatric: She has a normal mood and affect. Her behavior is normal.  Nursing note and vitals reviewed.   ED Course  Procedures  DIAGNOSTIC STUDIES: Oxygen Saturation is 100% on RA, normal by my interpretation.  COORDINATION OF CARE: 1:39 AM-Will order imaging and blood work. Discussed treatment plan with pt at bedside and pt agreed to plan.   Labs Review Labs Reviewed  BASIC METABOLIC PANEL - Abnormal; Notable for the following:    Potassium 3.0 (*)    All other components within normal limits  CBC - Abnormal; Notable for the following:    Hemoglobin 8.6 (*)    HCT 28.0 (*)    MCV 63.1 (*)    MCH 19.4 (*)    RDW 18.2 (*)    Platelets 516 (*)    All other components within normal limits  I-STAT TROPOININ, ED    Imaging Review Dg Chest 2 View  05/15/2016  CLINICAL DATA:  40 year old female with chest pain EXAM: CHEST  2 VIEW COMPARISON:  Chest CT dated 04/18/2015 FINDINGS: Two views of the chest do not demonstrate a focal consolidation. There is no pleural effusion or pneumothorax. There is mild eventration of the left hemidiaphragm. The cardiac silhouette is within normal limits. No acute  osseous pathology. IMPRESSION: No active cardiopulmonary disease. Electronically Signed   By: Elgie Collard M.D.   On: 05/15/2016 01:47   I have personally reviewed and evaluated these images and lab results as part of my medical decision-making.   EKG Interpretation None      MDM   Final diagnoses:  Dyspnea  Anemia, unspecified anemia type  PVC's (premature ventricular contractions)   Essentially symptomatically here. Was given 40 by mouth potassium for K of 3.0. Has occasional PVCs which she is aware of.  He will move a 0.6. She is ranged between 10 and 8.6 over the last several times she has had her blood drawn. She states she thinks she's been told about this at some point in the past. She does have rather heavy periods. They last up to 7 days as uses multiple pads per day with clots. Her GYN health is handled through her primary care.  Advised her to follow-up with primary care regarding discussion about hormone treatment to limit her menses which may help her chronic anemia.   I personally performed the services described in this documentation, which was scribed in my presence. The recorded information has been reviewed and is accurate.    Rolland PorterMark Pablo Stauffer, MD 05/15/16 [phone]

## 2016-05-15 NOTE — Discharge Instructions (Signed)
Talk to your Primary Care Physician about your heavy periods. Iron prescription.   Anemia, Nonspecific Anemia is a condition in which the concentration of red blood cells or hemoglobin in the blood is below normal. Hemoglobin is a substance in red blood cells that carries oxygen to the tissues of the body. Anemia results in not enough oxygen reaching these tissues.  CAUSES  Common causes of anemia include:   Excessive bleeding. Bleeding may be internal or external. This includes excessive bleeding from periods (in women) or from the intestine.   Poor nutrition.   Chronic kidney, thyroid, and liver disease.  Bone marrow disorders that decrease red blood cell production.  Cancer and treatments for cancer.  HIV, AIDS, and their treatments.  Spleen problems that increase red blood cell destruction.  Blood disorders.  Excess destruction of red blood cells due to infection, medicines, and autoimmune disorders. SIGNS AND SYMPTOMS   Minor weakness.   Dizziness.   Headache.  Palpitations.   Shortness of breath, especially with exercise.   Paleness.  Cold sensitivity.  Indigestion.  Nausea.  Difficulty sleeping.  Difficulty concentrating. Symptoms may occur suddenly or they may develop slowly.  DIAGNOSIS  Additional blood tests are often needed. These help your health care provider determine the best treatment. Your health care provider will check your stool for blood and look for other causes of blood loss.  TREATMENT  Treatment varies depending on the cause of the anemia. Treatment can include:   Supplements of iron, vitamin B12, or folic acid.   Hormone medicines.   A blood transfusion. This may be needed if blood loss is severe.   Hospitalization. This may be needed if there is significant continual blood loss.   Dietary changes.  Spleen removal. HOME CARE INSTRUCTIONS Keep all follow-up appointments. It often takes many weeks to correct anemia,  and having your health care provider check on your condition and your response to treatment is very important. SEEK IMMEDIATE MEDICAL CARE IF:   You develop extreme weakness, shortness of breath, or chest pain.   You become dizzy or have trouble concentrating.  You develop heavy vaginal bleeding.   You develop a rash.   You have bloody or black, tarry stools.   You faint.   You vomit up blood.   You vomit repeatedly.   You have abdominal pain.  You have a fever or persistent symptoms for more than 2-3 days.   You have a fever and your symptoms suddenly get worse.   You are dehydrated.  MAKE SURE YOU:  Understand these instructions.  Will watch your condition.  Will get help right away if you are not doing well or get worse.   This information is not intended to replace advice given to you by your health care provider. Make sure you discuss any questions you have with your health care provider.   Document Released: 12/09/2004 Document Revised: 07/04/2013 Document Reviewed: 04/27/2013 Elsevier Interactive Patient Education 2016 [employer]  Shortness of Breath Shortness of breath means you have trouble breathing. Shortness of breath needs medical care right away. HOME CARE   Do not smoke.  Avoid being around chemicals or things (paint fumes, dust) that may bother your breathing.  Rest as needed. Slowly begin your normal activities.  Only take medicines as told by your doctor.  Keep all doctor visits as told. GET HELP RIGHT AWAY IF:   Your shortness of breath gets worse.  You feel lightheaded, pass out (faint), or have a  cough that is not helped by medicine.  You cough up blood.  You have pain with breathing.  You have pain in your chest, arms, shoulders, or belly (abdomen).  You have a fever.  You cannot walk up stairs or exercise the way you normally do.  You do not get better in the time expected.  You have a hard time doing normal  activities even with rest.  You have problems with your medicines.  You have any new symptoms. MAKE SURE YOU:  Understand these instructions.  Will watch your condition.  Will get help right away if you are not doing well or get worse.   This information is not intended to replace advice given to you by your health care provider. Make sure you discuss any questions you have with your health care provider.   Document Released: 04/19/2008 Document Revised: 11/06/2013 Document Reviewed: 01/17/2012 Elsevier Interactive Patient Education [employer]

## 2016-05-15 NOTE — ED Notes (Signed)
Pt states she has had sob x 1 day and has had CP x 1 hour. Pt has clear lung sounds and has a saturation of 100% on room air. Pt states the CP is in the middle of her chest and does not radiate anywhere. Pt states the pain is a pressure. Pt denies lightheadedness, nausea, and is not diaphoretic. Pt states she has been under extra stress recently as she just moved.

## 2016-10-01 ENCOUNTER — Emergency Department (HOSPITAL_COMMUNITY): Payer: Medicaid Other

## 2016-10-01 ENCOUNTER — Emergency Department (HOSPITAL_COMMUNITY)
Admission: EM | Admit: 2016-10-01 | Discharge: 2016-10-01 | Disposition: A | Discharge status: Home / Self Care | Payer: Medicaid Other | Attending: Physician Assistant | Admitting: Physician Assistant

## 2016-10-01 ENCOUNTER — Encounter (HOSPITAL_COMMUNITY): Payer: Self-pay | Admitting: Emergency Medicine

## 2016-10-01 DIAGNOSIS — J45909 Unspecified asthma, uncomplicated: Secondary | ICD-10-CM | POA: Insufficient documentation

## 2016-10-01 DIAGNOSIS — R05 Cough: Secondary | ICD-10-CM | POA: Diagnosis present

## 2016-10-01 DIAGNOSIS — Z79899 Other long term (current) drug therapy: Secondary | ICD-10-CM | POA: Insufficient documentation

## 2016-10-01 DIAGNOSIS — J209 Acute bronchitis, unspecified: Secondary | ICD-10-CM | POA: Insufficient documentation

## 2016-10-01 MED ORDER — ALBUTEROL SULFATE HFA 108 (90 BASE) MCG/ACT IN AERS
2.0000 | INHALATION_SPRAY | RESPIRATORY_TRACT | Status: DC
  Administered 2016-10-01: 2 via RESPIRATORY_TRACT
  Filled 2016-10-01: qty 6.7

## 2016-10-01 MED ORDER — BENZONATATE 100 MG PO CAPS: 100.0000 mg | ORAL_CAPSULE | Freq: Three times a day (TID) | ORAL | 0 refills | Status: DC | PRN

## 2016-10-01 NOTE — ED Triage Notes (Signed)
Pt reports productive cough and nasal congestion for a week associated central chest pain ONLY with cough.

## 2016-10-01 NOTE — ED Notes (Addendum)
Pt transported to XR. Pt ambulated with a steady gait.

## 2016-10-01 NOTE — Discharge Instructions (Signed)
Contact a health care provider if: °Your symptoms do not improve in 2 weeks of treatment. °Get help right away if: °You cough up blood. °You have chest pain. °You have severe shortness of breath. °You become dehydrated. °You faint or keep feeling like you are going to faint. °You keep vomiting. °You have a severe headache. °Your fever or chills gets worse. °

## 2016-10-01 NOTE — ED Provider Notes (Signed)
[facility]-EMERGENCY DEPT Provider Note   CSN: [id number] Arrival date & time: 10/01/16  1747   By signing my name below, I, Teofilo Pod, attest that this documentation has been prepared under the direction and in the presence of Alayha Babineaux, [place]. Electronically Signed: Teofilo Pod, ED Scribe. 10/01/2016. 6:16 PM.   History   Chief Complaint Chief Complaint  Patient presents with  . Cough     The history is provided by the patient. No language interpreter was used.   HPI Comments:  Christina Armstrong is a 40 y.o. female who presents to the Emergency Department complaining of a constant productive cough x 2 days. Pt states that her cough has a dark yellow sputum with a small amount of blood an a few occasions. Pt complains of associated nasal congestion, sore throat, rhinorrhea, headache, SOB, decreased appetite and reports that she has central chest pain when she coughs. Pt states that she began having these symptoms 6 days ago and then they improved slightly, but then she began having a cough 2 days ago and her other symptoms did not resolve. Pt is not a smoker. Pt reports that she has a MVP. Pt took theraflu for her initial cold symptoms with moderate relief. Pt has taken Advil and theraflu with no relief for cough. Pt denies fever, chills, nausea, vomiting, diarrhea, dysuria.   Past Medical History:  Diagnosis Date  . Anxiety   . Asthma    seasonal bronchitis  . MVP (mitral valve prolapse)   . Panic attacks     There are no active problems to display for this patient.   Past Surgical History:  Procedure Laterality Date  . CESAREAN SECTION    . TUBAL LIGATION      OB History    Gravida Para Term Preterm AB Living   5 4 4  0 1 4   SAB TAB Ectopic Multiple Live Births   1 0 0 0 4       Home Medications    Prior to Admission medications   Medication Sig Start Date End Date Taking? Authorizing Provider  benzonatate (TESSALON) 100 MG capsule Take 1 capsule  (100 mg total) by mouth 3 (three) times daily as needed for cough. 10/01/16   Frayda Egley Manuel [place], [place]  ferrous sulfate 325 (65 FE) MG tablet Take 1 tablet (325 mg total) by mouth 2 (two) times daily with a meal. 05/15/16   Rolland Porter, MD  hydroxypropyl methylcellulose / hypromellose (ISOPTO TEARS / GONIOVISC) 2.5 % ophthalmic solution Place 1 drop into both eyes 3 (three) times daily as needed for dry eyes.    Historical Provider, MD  ibuprofen (ADVIL,MOTRIN) 200 MG tablet Take 400 mg by mouth every 6 (six) hours as needed (for pain.).    Historical Provider, MD  Multiple Vitamin (MULTIVITAMIN WITH MINERALS) TABS tablet Take 1 tablet by mouth daily.    Historical Provider, MD    Family History Family History  Problem Relation Age of Onset  . Other Neg Hx     Social History Social History  Substance Use Topics  . Smoking status: Never Smoker  . Smokeless tobacco: Not on file  . Alcohol use Yes     Comment: occasional alcohol     Allergies   Ciprofloxacin and Codeine   Review of Systems Review of Systems  Constitutional: Positive for appetite change. Negative for chills and fever.  HENT: Positive for congestion, postnasal drip, rhinorrhea and sore throat. Negative for ear pain, hearing  loss, tinnitus and voice change.   Eyes: Negative for visual disturbance.  Respiratory: Positive for cough (Persistent and purulent) and shortness of breath.   Cardiovascular: Positive for chest pain (On deep inspiration). Negative for leg swelling.  Gastrointestinal: Negative for abdominal pain, constipation, diarrhea, nausea and vomiting.  Genitourinary: Negative for difficulty urinating and dysuria.  Musculoskeletal: Negative for arthralgias, back pain and myalgias.  Neurological: Positive for headaches. Negative for speech difficulty.  All other systems reviewed and are negative.    Physical Exam Updated Vital Signs BP 127/88 (BP Location: Left Arm)   Pulse 83   Temp 98.5 F (36.9 C)  (Oral)   Resp 18   Wt 95.3 kg   LMP 08/31/2016   SpO2 100%   BMI 39.68 kg/m   Physical Exam  Constitutional: She is oriented to person, place, and time. She appears well-developed and well-nourished. No distress.  HENT:  Head: Normocephalic and atraumatic.  Right Ear: Hearing, tympanic membrane, external ear and ear canal normal.  Left Ear: Hearing, tympanic membrane, external ear and ear canal normal.  Nose: Nose normal.  Mouth/Throat: Uvula is midline and mucous membranes are normal. No oral lesions. Posterior oropharyngeal erythema (mild) present. No oropharyngeal exudate, posterior oropharyngeal edema or tonsillar abscesses. No tonsillar exudate.  Eyes: Conjunctivae and EOM are normal. Pupils are equal, round, and reactive to light.  Neck: Normal range of motion. Neck supple. No tracheal deviation present.  Cardiovascular: Normal rate and regular rhythm.   Pulmonary/Chest: Effort normal and breath sounds normal. No respiratory distress. She has no wheezes. She has no rales.  Abdominal: Soft. She exhibits no distension. There is no tenderness.  Musculoskeletal: Normal range of motion.  Neurological: She is alert and oriented to person, place, and time. No cranial nerve deficit.  Skin: Skin is warm and dry. Capillary refill takes less than 2 seconds.  Psychiatric: She has a normal mood and affect. Her behavior is normal.  Nursing note and vitals reviewed.    ED Treatments / Results  DIAGNOSTIC STUDIES:  Oxygen Saturation is 100% on RA, normal by my interpretation.    COORDINATION OF CARE:  6:16 PM Discussed treatment plan with pt at bedside and pt agreed to plan.   Labs (all labs ordered are listed, but only abnormal results are displayed) Labs Reviewed - No data to display  EKG  EKG Interpretation None       Radiology Dg Chest 2 View  Result Date: 10/01/2016 CLINICAL DATA:  Acute onset of productive cough. Sore throat, chest congestion and mid chest pain.  Chills and body aches. Initial encounter. EXAM: CHEST  2 VIEW COMPARISON:  Chest radiograph performed 05/15/2016 FINDINGS: The lungs are well-aerated. There is mild elevation of the left hemidiaphragm. There is no evidence of focal opacification, pleural effusion or pneumothorax. The heart is borderline normal in size; the mediastinal contour is within normal limits. No acute osseous abnormalities are seen. IMPRESSION: Mild elevation of the left hemidiaphragm. Lungs remain grossly clear. Electronically Signed   By: Roanna RaiderJeffery  Chang M.D.   On: 10/01/2016 18:56    Procedures Procedures (including critical care time)  Medications Ordered in ED Medications  albuterol (PROVENTIL HFA;VENTOLIN HFA) 108 (90 Base) MCG/ACT inhaler 2 puff (2 puffs Inhalation Given 10/01/16 2039)     Initial Impression / Assessment and Plan / ED Course  I have reviewed the triage vital signs and the nursing notes.  Pertinent labs & imaging results that were available during my care of the patient were reviewed  by me and considered in my medical decision making (see chart for details).  Clinical Course   Patient reports having cold-like symptoms for 1 week that seemed to be improving until Wednesday when she started having symptoms of persistent cough. Patient complained of pleuritic chest pain and shortness of breath that she says that is related to her persistent productive cough with dark yellow mucus and red streaks. Patient denies history of smoking, travel, and any recent contacts or exposure to TB. Low suspicion of PE at this time. Chest x-ray is negative for pneumonia and effusions. Blood streaks in mucus most likely due to pharyngeal irritation. Most likely acute bronchitis of viral etiology. Plan is to give albuterol inhaler, Tessalon Perles, and over-the-counter ibuprofen as needed. I don't think antibiotics are needed at this time. Patient is hemodynamically stable and afebrile with good oxygen saturation at this  time. Patient agrees with assessment and plan. Patient specifically instructed to return for new or worsening symptoms such as severe shortness of breath, vomiting, severe headache, fever, chest pain, or coughing up blood.   Final Clinical Impressions(s) / ED Diagnoses   Final diagnoses:  Acute bronchitis, unspecified organism    New Prescriptions Discharge Medication List as of 10/01/2016  8:21 PM    START taking these medications   Details  benzonatate (TESSALON) 100 MG capsule Take 1 capsule (100 mg total) by mouth 3 (three) times daily as needed for cough., Starting Fri 10/01/2016, Print      I personally performed the services described in this documentation, which was scribed in my presence. The recorded information has been reviewed and is accurate.     [address]Azuree Minish Manuel [place], [place] 10/01/16 2115    Courteney Randall AnLyn Mackuen, MD 10/01/16 2231

## 2017-09-06 ENCOUNTER — Encounter (HOSPITAL_COMMUNITY): Payer: Self-pay

## 2017-09-06 ENCOUNTER — Emergency Department (HOSPITAL_COMMUNITY)
Admission: EM | Admit: 2017-09-06 | Discharge: 2017-09-07 | Disposition: A | Discharge status: Home / Self Care | Payer: Medicaid Other | Attending: Emergency Medicine | Admitting: Emergency Medicine

## 2017-09-06 DIAGNOSIS — G44019 Episodic cluster headache, not intractable: Secondary | ICD-10-CM | POA: Diagnosis not present

## 2017-09-06 DIAGNOSIS — J45909 Unspecified asthma, uncomplicated: Secondary | ICD-10-CM | POA: Insufficient documentation

## 2017-09-06 DIAGNOSIS — R51 Headache: Secondary | ICD-10-CM | POA: Diagnosis present

## 2017-09-06 MED ORDER — KETOROLAC TROMETHAMINE 30 MG/ML IJ SOLN
30.0000 mg | Freq: Once | INTRAMUSCULAR | Status: AC
  Administered 2017-09-06: 30 mg via INTRAMUSCULAR
  Filled 2017-09-06: qty 1

## 2017-09-06 NOTE — ED Provider Notes (Signed)
[facility]   CSN: [id number] Arrival date & time: 09/06/17  1728     History   Chief Complaint Chief Complaint  Patient presents with  . Headache    HPI Christina Armstrong is a 41 y.o. female.  HPI  His is a 41 year old female who presents with headache. Patient reports onset of left-sided headache at 4 PM while driving. She noted intermittent sharp pains and fullness in the left eye. She denies any eye tearing. She has a history of tension headaches but denies anything similar in the past. Currently her pain is 5 out of 10. She denies fevers or neck stiffness. No nausea, vomiting, photophobia, weakness, numbness, tingling.  Past Medical History:  Diagnosis Date  . Anxiety   . Asthma    seasonal bronchitis  . MVP (mitral valve prolapse)   . Panic attacks     There are no active problems to display for this patient.   Past Surgical History:  Procedure Laterality Date  . CESAREAN SECTION    . TUBAL LIGATION      OB History    Gravida Para Term Preterm AB Living   5 4 4  0 1 4   SAB TAB Ectopic Multiple Live Births   1 0 0 0 4       Home Medications    Prior to Admission medications   Medication Sig Start Date End Date Taking? Authorizing Provider  benzonatate (TESSALON) 100 MG capsule Take 1 capsule (100 mg total) by mouth 3 (three) times daily as needed for cough. Patient not taking: Reported on 09/07/2017 10/01/16   Alvina ChouEspina, [place], [place]  ferrous sulfate 325 (65 FE) MG tablet Take 1 tablet (325 mg total) by mouth 2 (two) times daily with a meal. Patient not taking: Reported on 09/07/2017 05/15/16   Rolland PorterJames, Mark, MD    Family History Family History  Problem Relation Age of Onset  . Other Neg Hx     Social History Social History  Substance Use Topics  . Smoking status: Never Smoker  . Smokeless tobacco: Never Used  . Alcohol use Yes     Comment: occasional alcohol     Allergies     Ciprofloxacin and Codeine   Review of Systems Review of Systems  Constitutional: Negative for fever.  Eyes: Negative for photophobia and visual disturbance.  Respiratory: Negative for shortness of breath.   Cardiovascular: Negative for chest pain.  Neurological: Positive for headaches. Negative for weakness and numbness.  All other systems reviewed and are negative.    Physical Exam Updated Vital Signs BP 118/70   Pulse 70   Temp 99.3 F (37.4 C) (Oral)   Resp 14   Ht 5\' 2"  (1.575 m)   Wt 98.4 kg (217 lb)   LMP 08/15/2017   SpO2 99%   BMI 39.69 kg/m   Physical Exam  Constitutional: She is oriented to person, place, and time. She appears well-developed and well-nourished.  HENT:  Head: Normocephalic and atraumatic.  Cardiovascular: Normal rate, regular rhythm and normal heart sounds.   Pulmonary/Chest: Effort normal and breath sounds normal. No respiratory distress. She has no wheezes.  Abdominal: Soft. There is no tenderness.  Neurological: She is alert and oriented to person, place, and time.  Cranial 2 through 12 intact, 5 out of 5 strength in all 4 extremities, no dysmetria to finger-nose-finger  Skin: Skin is warm and dry.  Psychiatric: She has a normal mood and affect.  Nursing note and vitals reviewed.    ED Treatments / Results  Labs (all labs ordered are listed, but only abnormal results are displayed) Labs Reviewed - No data to display  EKG  EKG Interpretation None       Radiology No results found.  Procedures Procedures (including critical care time)  Medications Ordered in ED Medications  ketorolac (TORADOL) 30 MG/ML injection 30 mg (30 mg Intramuscular Given 09/06/17 2345)     Initial Impression / Assessment and Plan / ED Course  I have reviewed the triage vital signs and the nursing notes.  Pertinent labs & imaging results that were available during my care of the patient were reviewed by me and considered in my medical decision  making (see chart for details).     Patient presents with left sided headache and eye pressure. She is nontoxic-appearing on exam. Neurologically intact. Vital signs are reassuring. Denies worst headache of her life. No infectious symptoms to indicate meningitis. Symptoms seem most consistent with likely a cluster headache. She also reports some upper respiratory symptoms. Patient was given IM Toradol. On recheck, she states she feels much better. Will have her follow-up with her primary physician otherwise symptom control recommended.  After history, exam, and medical workup I feel the patient has been appropriately medically screened and is safe for discharge home. Pertinent diagnoses were discussed with the patient. Patient was given return precautions.   Final Clinical Impressions(s) / ED Diagnoses   Final diagnoses:  Episodic cluster headache, not intractable    New Prescriptions New Prescriptions   No medications on file     Shon Baton, MD 09/07/17 0100

## 2017-09-06 NOTE — ED Triage Notes (Signed)
Pt reports sharp pain on left side of head while driving around 1:614:30. Pt states after initial sharp pain she felt "pressure" to left side of head. PT endorses left ear pressure, left eye pressure, left neck tightness. She states she has experienced nasal congestion x several days. Denies blurred vision, photosensitivity, n/v, confusion, weakness, tingling. A&Ox 4

## 2017-09-15 ENCOUNTER — Emergency Department (HOSPITAL_BASED_OUTPATIENT_CLINIC_OR_DEPARTMENT_OTHER): Payer: Medicaid Other

## 2017-09-15 ENCOUNTER — Encounter (HOSPITAL_BASED_OUTPATIENT_CLINIC_OR_DEPARTMENT_OTHER): Payer: Self-pay | Admitting: *Deleted

## 2017-09-15 ENCOUNTER — Emergency Department (HOSPITAL_BASED_OUTPATIENT_CLINIC_OR_DEPARTMENT_OTHER)
Admission: EM | Admit: 2017-09-15 | Discharge: 2017-09-15 | Disposition: A | Discharge status: Home / Self Care | Payer: Medicaid Other | Attending: Emergency Medicine | Admitting: Emergency Medicine

## 2017-09-15 DIAGNOSIS — R51 Headache: Secondary | ICD-10-CM | POA: Diagnosis present

## 2017-09-15 DIAGNOSIS — R519 Headache, unspecified: Secondary | ICD-10-CM

## 2017-09-15 DIAGNOSIS — J45909 Unspecified asthma, uncomplicated: Secondary | ICD-10-CM | POA: Diagnosis not present

## 2017-09-15 MED ORDER — METOCLOPRAMIDE HCL 5 MG/ML IJ SOLN
10.0000 mg | Freq: Once | INTRAMUSCULAR | Status: AC
  Administered 2017-09-15: 10 mg via INTRAMUSCULAR
  Filled 2017-09-15: qty 2

## 2017-09-15 MED ORDER — KETOROLAC TROMETHAMINE 60 MG/2ML IM SOLN
60.0000 mg | Freq: Once | INTRAMUSCULAR | Status: AC
  Administered 2017-09-15: 60 mg via INTRAMUSCULAR
  Filled 2017-09-15: qty 2

## 2017-09-15 MED ORDER — DEXAMETHASONE SODIUM PHOSPHATE 10 MG/ML IJ SOLN
10.0000 mg | Freq: Once | INTRAMUSCULAR | Status: AC
  Administered 2017-09-15: 10 mg via INTRAMUSCULAR
  Filled 2017-09-15: qty 1

## 2017-09-15 NOTE — ED Provider Notes (Signed)
[facility] EMERGENCY DEPARTMENT Provider Note   CSN: [id number] Arrival date & time: 09/15/17  1716     History   Chief Complaint Chief Complaint  Patient presents with  . Dizziness  . Headache    HPI Christina Armstrong is a 41 y.o. female.  Patient was seen on 09/06/2017 for headaches that were severe on the left side of her head that came in waves.  At that time she was diagnosed with cluster headaches.  She was given Toradol and states several days after she left the emergency room the pain had resolved but then returned.  Today she states with a wave of pain her left side of her face started feeling unusual.  She stated it felt heavy and slightly numb which has not resolved.  The pain only comes and sharp waves that is no more than 2 or 3 seconds but she has a dull pain on the left side.  She denies any vision changes.  She has been taking some ibuprofen at home that does not seem to help.  She denies any fever but states she has had some nasal congestion.  She has a history of allergies but has not been on medication for some time.  She has no ear pain but states it feels like her hearing is slightly decreased on the left side.  She denies any trauma or manipulation of her neck just prior to the pain starting.  She denies any numbness, weakness in her left upper or lower extremity.  No aphasia or swallowing difficulty.  States this pain is not the worst of life.   The history is provided by the patient.  Headache   This is a new problem. Episode onset: 8 days ago. Episode frequency: intermittently but sometimes not even every day. The problem has been gradually worsening. The headache is associated with nothing. The pain is located in the left unilateral region. The quality of the pain is described as sharp (will sometimes have a dull pressure but will then get severe sharp bursts of pain). The pain is moderate. Pertinent negatives include no anorexia, no fever, no malaise/fatigue,  no palpitations, no shortness of breath, no nausea and no vomiting. She has tried NSAIDs for the symptoms. The treatment provided no relief.    Past Medical History:  Diagnosis Date  . Anxiety   . Asthma    seasonal bronchitis  . MVP (mitral valve prolapse)   . Panic attacks     There are no active problems to display for this patient.   Past Surgical History:  Procedure Laterality Date  . CESAREAN SECTION    . TUBAL LIGATION      OB History    Gravida Para Term Preterm AB Living   5 4 4  0 1 4   SAB TAB Ectopic Multiple Live Births   1 0 0 0 4       Home Medications    Prior to Admission medications   Medication Sig Start Date End Date Taking? Authorizing Provider  benzonatate (TESSALON) 100 MG capsule Take 1 capsule (100 mg total) by mouth 3 (three) times daily as needed for cough. Patient not taking: Reported on 09/07/2017 10/01/16   Alvina ChouEspina, Francisco [place], [place]  ferrous sulfate 325 (65 FE) MG tablet Take 1 tablet (325 mg total) by mouth 2 (two) times daily with a meal. Patient not taking: Reported on 09/07/2017 05/15/16   Rolland PorterJames, Mark, MD    Family History Family History  Problem Relation Age of Onset  . Other Neg Hx     Social History Social History  Substance Use Topics  . Smoking status: Never Smoker  . Smokeless tobacco: Never Used  . Alcohol use Yes     Comment: occasional alcohol     Allergies   Ciprofloxacin and Codeine   Review of Systems Review of Systems  Constitutional: Negative for fever and malaise/fatigue.  Respiratory: Negative for shortness of breath.   Cardiovascular: Negative for palpitations.  Gastrointestinal: Negative for anorexia, nausea and vomiting.  Neurological: Positive for headaches.  All other systems reviewed and are negative.    Physical Exam Updated Vital Signs BP 125/84   Pulse 84   Temp 99.3 F (37.4 C) (Oral)   Resp 18   Ht 5\' 2"  (1.575 m)   Wt 98.4 kg (217 lb)   LMP 08/15/2017   SpO2 100%   BMI 39.69  kg/m   Physical Exam  Constitutional: She is oriented to person, place, and time. She appears well-developed and well-nourished. No distress.  HENT:  Head: Normocephalic and atraumatic.  Nose: Mucosal edema present. Right sinus exhibits no maxillary sinus tenderness and no frontal sinus tenderness. Left sinus exhibits no maxillary sinus tenderness and no frontal sinus tenderness.  Mouth/Throat: Oropharynx is clear and moist.  Bilateral cerumen impaction.  Notable edema in the left nare  Eyes: Pupils are equal, round, and reactive to light. Conjunctivae and EOM are normal.  Neck: Trachea normal and normal range of motion. Neck supple.    Cardiovascular: Normal rate, regular rhythm and intact distal pulses.   No murmur heard. Pulmonary/Chest: Effort normal and breath sounds normal. No respiratory distress. She has no wheezes. She has no rales.  Musculoskeletal: Normal range of motion. She exhibits no edema or tenderness.  Neurological: She is alert and oriented to person, place, and time. She has normal strength. No cranial nerve deficit or sensory deficit. Coordination and gait normal.  No facial droop noted  Skin: Skin is warm and dry. No rash noted. No erythema.  Psychiatric: She has a normal mood and affect. Her behavior is normal.  Nursing note and vitals reviewed.    ED Treatments / Results  Labs (all labs ordered are listed, but only abnormal results are displayed) Labs Reviewed - No data to display  EKG  EKG Interpretation None       Radiology Ct Head Wo Contrast  Result Date: 09/15/2017 CLINICAL DATA:  41 y/o F; dizziness, left-sided headache, pressure behind left eye, and facial numbness. EXAM: CT HEAD WITHOUT CONTRAST CT MAXILLOFACIAL WITHOUT CONTRAST TECHNIQUE: Multidetector CT imaging of the head and maxillofacial structures were performed using the standard protocol without intravenous contrast. Multiplanar CT image reconstructions of the maxillofacial structures  were also generated. COMPARISON:  02/12/2009 CT head. FINDINGS: CT HEAD FINDINGS Brain: No evidence of acute infarction, hemorrhage, hydrocephalus, extra-axial collection or mass lesion/mass effect. Vascular: No hyperdense vessel or unexpected calcification. Skull: Normal. Negative for fracture or focal lesion. Other: None. CT MAXILLOFACIAL FINDINGS Osseous: No fracture or mandibular dislocation. No destructive process. Orbits: Negative. No traumatic or inflammatory finding. Sinuses: Clear. Soft tissues: Negative. IMPRESSION: 1. Normal CT of head. 2. Normal maxillofacial CT. Electronically Signed   By: Mitzi Hansen M.D.   On: 09/15/2017 18:32   Ct Maxillofacial  Ltd Wo Cm  Result Date: 09/15/2017 CLINICAL DATA:  41 y/o F; dizziness, left-sided headache, pressure behind left eye, and facial numbness. EXAM: CT HEAD WITHOUT CONTRAST CT MAXILLOFACIAL WITHOUT CONTRAST TECHNIQUE:  Multidetector CT imaging of the head and maxillofacial structures were performed using the standard protocol without intravenous contrast. Multiplanar CT image reconstructions of the maxillofacial structures were also generated. COMPARISON:  02/12/2009 CT head. FINDINGS: CT HEAD FINDINGS Brain: No evidence of acute infarction, hemorrhage, hydrocephalus, extra-axial collection or mass lesion/mass effect. Vascular: No hyperdense vessel or unexpected calcification. Skull: Normal. Negative for fracture or focal lesion. Other: None. CT MAXILLOFACIAL FINDINGS Osseous: No fracture or mandibular dislocation. No destructive process. Orbits: Negative. No traumatic or inflammatory finding. Sinuses: Clear. Soft tissues: Negative. IMPRESSION: 1. Normal CT of head. 2. Normal maxillofacial CT. Electronically Signed   By: Mitzi Hansen M.D.   On: 09/15/2017 18:32    Procedures Procedures (including critical care time)  Medications Ordered in ED Medications - No data to display   Initial Impression / Assessment and Plan / ED  Course  I have reviewed the triage vital signs and the nursing notes.  Pertinent labs & imaging results that were available during my care of the patient were reviewed by me and considered in my medical decision making (see chart for details).     Patient presenting with worsening headaches and today a new sensation in the left side of her face that she describes as slightly heavy and may be a little numb.  Patient was diagnosed with cluster headaches approximately 8 days ago when she was seen in the emergency room with left-sided headaches which started relatively suddenly.  Headaches do not seem classic for subarachnoid hemorrhage as it is not the worst of life it was not abrupt in its been waxing and waning.  Patient has had some sinus congestion with this but is not aware of a fever.  Temp today is 99.3.  Patient denies any symptoms in the arm or leg and has no known medical problems other than mitral valve prolapse.  She is not displaying any strokelike symptoms.  She is not have any facial droop or decreased sensation on exam.  Concern for potential sinus etiology versus trigeminal neuralgia or cluster headaches.  However will scan of the head and sinuses to further evaluate.  Patient is not currently having pain but still has a unusual sensation in her face as she reports her left eyelid feels heavy.  7:21 PM CT is negative for acute pathology.  Low suspicion for carotid or vertebral dissection or cavernous venous thrombosis.  Discussed findings with patient.  Recommended follow-up with PCP and neurology.    Final Clinical Impressions(s) / ED Diagnoses   Final diagnoses:  Nonintractable episodic headache, unspecified headache type    New Prescriptions New Prescriptions   No medications on file     Gwyneth Sprout, MD 09/15/17 1921

## 2017-09-15 NOTE — ED Triage Notes (Signed)
Dizziness and headache. She was seen for same at [facility] last week. Pain is no better.

## 2017-11-22 ENCOUNTER — Inpatient Hospital Stay (HOSPITAL_COMMUNITY)
Admission: AD | Admit: 2017-11-22 | Discharge: 2017-11-23 | Disposition: A | Discharge status: Home / Self Care | Payer: Medicaid Other | Source: Ambulatory Visit | Attending: Family Medicine | Admitting: Family Medicine

## 2017-11-22 ENCOUNTER — Encounter (HOSPITAL_COMMUNITY): Payer: Self-pay | Admitting: *Deleted

## 2017-11-22 DIAGNOSIS — N760 Acute vaginitis: Secondary | ICD-10-CM | POA: Diagnosis not present

## 2017-11-22 DIAGNOSIS — Z9889 Other specified postprocedural states: Secondary | ICD-10-CM | POA: Diagnosis not present

## 2017-11-22 DIAGNOSIS — L739 Follicular disorder, unspecified: Secondary | ICD-10-CM

## 2017-11-22 DIAGNOSIS — Z3202 Encounter for pregnancy test, result negative: Secondary | ICD-10-CM | POA: Diagnosis not present

## 2017-11-22 DIAGNOSIS — Z881 Allergy status to other antibiotic agents status: Secondary | ICD-10-CM | POA: Insufficient documentation

## 2017-11-22 DIAGNOSIS — N898 Other specified noninflammatory disorders of vagina: Secondary | ICD-10-CM | POA: Diagnosis present

## 2017-11-22 DIAGNOSIS — Z9851 Tubal ligation status: Secondary | ICD-10-CM | POA: Insufficient documentation

## 2017-11-22 DIAGNOSIS — Z885 Allergy status to narcotic agent status: Secondary | ICD-10-CM | POA: Diagnosis not present

## 2017-11-22 DIAGNOSIS — B9689 Other specified bacterial agents as the cause of diseases classified elsewhere: Secondary | ICD-10-CM | POA: Diagnosis not present

## 2017-11-22 DIAGNOSIS — R350 Frequency of micturition: Secondary | ICD-10-CM | POA: Diagnosis present

## 2017-11-22 NOTE — MAU Note (Signed)
Pt reports vaginal frequency and urgency. Also urine has had an odor off and on for a couple of weeks. Pt thinks she might have a boil in vaginal area. She tried to squeeze it, but it made it worse. Pt also c/o vaginal odor and possible increase in discharge.

## 2017-11-23 DIAGNOSIS — L739 Follicular disorder, unspecified: Secondary | ICD-10-CM

## 2017-11-23 LAB — URINALYSIS, ROUTINE W REFLEX MICROSCOPIC
Bilirubin Urine: NEGATIVE
GLUCOSE, UA: NEGATIVE mg/dL
HGB URINE DIPSTICK: NEGATIVE
Ketones, ur: NEGATIVE mg/dL
NITRITE: NEGATIVE
PH: 5 (ref 5.0–8.0)
Protein, ur: NEGATIVE mg/dL
SPECIFIC GRAVITY, URINE: 1.012 (ref 1.005–1.030)

## 2017-11-23 LAB — WET PREP, GENITAL
SPERM: NONE SEEN
Trich, Wet Prep: NONE SEEN
Yeast Wet Prep HPF POC: NONE SEEN

## 2017-11-23 LAB — GC/CHLAMYDIA PROBE AMP (~~LOC~~) NOT AT ARMC
CHLAMYDIA, DNA PROBE: NEGATIVE
Neisseria Gonorrhea: NEGATIVE

## 2017-11-23 LAB — POCT PREGNANCY, URINE: PREG TEST UR: NEGATIVE

## 2017-11-23 MED ORDER — METRONIDAZOLE 500 MG PO TABS: 500.0000 mg | ORAL_TABLET | Freq: Two times a day (BID) | ORAL | 0 refills | Status: DC

## 2017-11-23 MED ORDER — SULFAMETHOXAZOLE-TRIMETHOPRIM 800-160 MG PO TABS: 1.0000 | ORAL_TABLET | Freq: Two times a day (BID) | ORAL | 0 refills | Status: AC

## 2017-11-23 NOTE — MAU Provider Note (Signed)
History     CSN: [id number]  Arrival date and time: 11/22/17 2322   First Provider Initiated Contact with Patient 11/22/17 2357     Chief Complaint  Patient presents with  . Urinary Frequency  . Groin Swelling   HPI Christina Armstrong is a 42 y.o. [id number] non pregnant female who presents with urinary frequency and multiple vaginal complaints. She reports frequency of urination and odor to her urine. She also reports an odorous vaginal discharge and painful intercourse. She reports a bump on her groin that has gotten worse over the last 2 days. She rates the pain in her groin a 5/10 and has tried to pop it without success.  OB History    Gravida Para Term Preterm AB Living   5 4 4  0 1 4   SAB TAB Ectopic Multiple Live Births   1 0 0 0 4      Past Medical History:  Diagnosis Date  . Anxiety   . Asthma    seasonal bronchitis  . MVP (mitral valve prolapse)   . Panic attacks     Past Surgical History:  Procedure Laterality Date  . CESAREAN SECTION    . TUBAL LIGATION      Family History  Problem Relation Age of Onset  . Other Neg Hx     Social History   Tobacco Use  . Smoking status: Never Smoker  . Smokeless tobacco: Never Used  Substance Use Topics  . Alcohol use: Yes    Comment: occasional alcohol  . Drug use: No    Allergies:  Allergies  Allergen Reactions  . Ciprofloxacin Itching  . Codeine Itching    Ok with benadryl    Medications Prior to Admission  Medication Sig Dispense Refill Last Dose  . benzonatate (TESSALON) 100 MG capsule Take 1 capsule (100 mg total) by mouth 3 (three) times daily as needed for cough. (Patient not taking: Reported on 09/07/2017) 21 capsule 0 Completed Course at Unknown time  . ferrous sulfate 325 (65 FE) MG tablet Take 1 tablet (325 mg total) by mouth 2 (two) times daily with a meal. (Patient not taking: Reported on 09/07/2017) 60 tablet 0 Not Taking at Unknown time    Review of Systems  Constitutional: Negative.  Negative  for fatigue and fever.  HENT: Negative.   Respiratory: Negative.  Negative for shortness of breath.   Cardiovascular: Negative.  Negative for chest pain.  Gastrointestinal: Negative.  Negative for abdominal pain, constipation, diarrhea, nausea and vomiting.  Genitourinary: Positive for frequency, vaginal discharge and vaginal pain. Negative for dysuria.  Neurological: Negative.  Negative for dizziness and headaches.   Physical Exam   Blood pressure 139/85, pulse (!) 102, temperature 98.9 F (37.2 C), temperature source Oral, resp. rate 18, height 5\' 2"  (1.575 m), weight 230 lb (104.3 kg), last menstrual period 10/15/2017.  Physical Exam  Nursing note and vitals reviewed. Constitutional: She is oriented to person, place, and time. She appears well-developed and well-nourished. No distress.  HENT:  Head: Normocephalic.  Eyes: Pupils are equal, round, and reactive to light.  Cardiovascular: Normal rate, regular rhythm and normal heart sounds.  Respiratory: Effort normal and breath sounds normal. No respiratory distress.  GI: Soft. Bowel sounds are normal. She exhibits no distension. There is no tenderness.  Genitourinary:     Neurological: She is alert and oriented to person, place, and time.  Skin: Skin is warm and dry.  Psychiatric: She has a normal mood and affect. Her  behavior is normal. Judgment and thought content normal.   Pelvic exam: Cervix pink, visually closed, without lesion, scant white creamy discharge, vaginal walls and external genitalia normal Bimanual exam: Cervix 0/[place]wine/high, firm, anterior, neg CMT, uterus nontender, nonenlarged, adnexa without tenderness, enlargement, or mass  MAU Course  Procedures Results for orders placed or performed during the hospital encounter of 11/22/17 (from the past 24 hour(s))  Urinalysis, Routine w reflex microscopic     Status: Abnormal   Collection Time: 11/22/17 11:29 PM  Result Value Ref Range   Color, Urine YELLOW YELLOW    APPearance HAZY (A) CLEAR   Specific Gravity, Urine 1.012 1.005 - 1.030   pH 5.0 5.0 - 8.0   Glucose, UA NEGATIVE NEGATIVE mg/dL   Hgb urine dipstick NEGATIVE NEGATIVE   Bilirubin Urine NEGATIVE NEGATIVE   Ketones, ur NEGATIVE NEGATIVE mg/dL   Protein, ur NEGATIVE NEGATIVE mg/dL   Nitrite NEGATIVE NEGATIVE   Leukocytes, UA TRACE (A) NEGATIVE   RBC / HPF 0-5 0 - 5 RBC/hpf   WBC, UA 6-30 0 - 5 WBC/hpf   Bacteria, UA MANY (A) NONE SEEN   Squamous Epithelial / LPF 0-5 (A) NONE SEEN   Mucus PRESENT    Hyaline Casts, UA PRESENT   Pregnancy, urine POC     Status: None   Collection Time: 11/23/17 12:04 AM  Result Value Ref Range   Preg Test, Ur NEGATIVE NEGATIVE  Wet prep, genital     Status: Abnormal   Collection Time: 11/23/17 12:10 AM  Result Value Ref Range   Yeast Wet Prep HPF POC NONE SEEN NONE SEEN   Trich, Wet Prep NONE SEEN NONE SEEN   Clue Cells Wet Prep HPF POC PRESENT (A) NONE SEEN   WBC, Wet Prep HPF POC MODERATE (A) NONE SEEN   Sperm NONE SEEN    MDM UA, UPT Urine Culture Wet prep and gc/chlamydia  Assessment and Plan   1. Folliculitis   2. Bacterial vaginitis    -Discharge home in stable condition -Rx for metronidazole and bactrim given to patient -Patient advised to follow-up with gyn of choice as needed for routine gyn care -Patient may return to MAU as needed or if her condition were to change or worsen  Rolm BookbinderCaroline M Reniah Cottingham CNM 11/23/2017, 12:36 AM   Allergies as of 11/23/2017      Reactions   Ciprofloxacin Itching   Codeine Itching   Ok with benadryl      Medication List    STOP taking these medications   benzonatate 100 MG capsule Commonly known as:  TESSALON   ferrous sulfate 325 (65 FE) MG tablet     TAKE these medications   metroNIDAZOLE 500 MG tablet Commonly known as:  FLAGYL Take 1 tablet (500 mg total) by mouth 2 (two) times daily.   sulfamethoxazole-trimethoprim 800-160 MG tablet Commonly known as:  BACTRIM DS,SEPTRA DS Take 1 tablet  by mouth 2 (two) times daily for 7 days.

## 2017-11-23 NOTE — Discharge Instructions (Signed)
In late 2019, the [facility] will be moving to the [place] campus. At that time, the MAU (Maternity Admissions Unit), where you are being seen today, will no longer take care of non-pregnant patients. We strongly encourage you to find a doctor's office before that time, so that you can be seen with any GYN concerns, like vaginal discharge, urinary tract infection, etc.. in a timely manner. ° °In order to make an office visit more convenient, the [facility] will be offering evening hours with same-day appointments, walk-in appointments and scheduled appointments available during this time. ° °[facility] Hours: °Monday - 8am - 7:30 pm with walk-in between 4pm- 7:30 pm °Tuesday - 8 am - 5 pm (starting 02/14/18 we will be open late and accepting walk-ins from 4pm - 7:30pm) °Wednesday - 8 am - 5 pm (starting 05/17/18 we will be open late and accepting walk-ins from 4pm - 7:30pm) °Thursday 8 am - 5 pm (starting 08/17/18 we will be open late and accepting walk-ins from 4pm - 7:30pm) °Friday 8 am - 5 pm ° °For an appointment please call the [facility] at [phone] ° °For urgent needs, [facility] is also available for management of urgent GYN complaints such as vaginal discharge or urinary tract infections. ° ° ° ° ° ° °Bacterial Vaginosis °Bacterial vaginosis is a vaginal infection that occurs when the normal balance of bacteria in the vagina is disrupted. It results from an overgrowth of certain bacteria. This is the most common vaginal infection among women ages 15-44. °Because bacterial vaginosis increases your risk for STIs (sexually transmitted infections), getting treated can help reduce your risk for chlamydia, gonorrhea, herpes, and HIV (human immunodeficiency virus). Treatment is also important for preventing complications in pregnant women, because this condition can cause an early  (premature) delivery. °What are the causes? °This condition is caused by an increase in harmful bacteria that are normally present in small amounts in the vagina. However, the reason that the condition develops is not fully understood. °What increases the risk? °The following factors may make you more likely to develop this condition: °· Having a new sexual partner or multiple sexual partners. °· Having unprotected sex. °· Douching. °· Having an intrauterine device (IUD). °· Smoking. °· Drug and alcohol abuse. °· Taking certain antibiotic medicines. °· Being pregnant. ° °You cannot get bacterial vaginosis from toilet seats, bedding, swimming pools, or contact with objects around you. °What are the signs or symptoms? °Symptoms of this condition include: °· Grey or white vaginal discharge. The discharge can also be watery or foamy. °· A fish-like odor with discharge, especially after sexual intercourse or during menstruation. °· Itching in and around the vagina. °· Burning or pain with urination. ° °Some women with bacterial vaginosis have no signs or symptoms. °How is this diagnosed? °This condition is diagnosed based on: °· Your medical history. °· A physical exam of the vagina. °· Testing a sample of vaginal fluid under a microscope to look for a large amount of bad bacteria or abnormal cells. Your health care provider may use a cotton swab or a small wooden spatula to collect the sample. ° °How is this treated? °This condition is treated with antibiotics. These may be given as a pill, a vaginal cream, or a medicine that is put into the vagina (suppository). If the condition comes back after treatment, a second round of antibiotics may   be needed. °Follow these instructions at home: °Medicines °· Take over-the-counter and prescription medicines only as told by your health care provider. °· Take or use your antibiotic as told by your health care provider. Do not stop taking or using the antibiotic even if you start  to feel better. °General instructions °· If you have a female sexual partner, tell her that you have a vaginal infection. She should see her health care provider and be treated if she has symptoms. If you have a female sexual partner, he does not need treatment. °· During treatment: °? Avoid sexual activity until you finish treatment. °? Do not douche. °? Avoid alcohol as directed by your health care provider. °? Avoid breastfeeding as directed by your health care provider. °· Drink enough water and fluids to keep your urine clear or pale yellow. °· Keep the area around your vagina and rectum clean. °? Wash the area daily with warm water. °? Wipe yourself from front to back after using the toilet. °· Keep all follow-up visits as told by your health care provider. This is important. °How is this prevented? °· Do not douche. °· Wash the outside of your vagina with warm water only. °· Use protection when having sex. This includes latex condoms and dental dams. °· Limit how many sexual partners you have. To help prevent bacterial vaginosis, it is best to have sex with just one partner (monogamous). °· Make sure you and your sexual partner are tested for STIs. °· Wear cotton or cotton-lined underwear. °· Avoid wearing tight pants and pantyhose, especially during summer. °· Limit the amount of alcohol that you drink. °· Do not use any products that contain nicotine or tobacco, such as cigarettes and e-cigarettes. If you need help quitting, ask your health care provider. °· Do not use illegal drugs. °Where to find more information: °· Centers for Disease Control and Prevention: www.cdc.gov/std °· American Sexual Health Association (ASHA): www.ashastd.org °· U.S. Department of Health and Human Services, Office on Women's Health: www.womenshealth.gov/ or https://www.womenshealth.gov/a-z-topics/bacterial-vaginosis °Contact a health care provider if: °· Your symptoms do not improve, even after treatment. °· You have more  discharge or pain when urinating. °· You have a fever. °· You have pain in your abdomen. °· You have pain during sex. °· You have vaginal bleeding between periods. °Summary °· Bacterial vaginosis is a vaginal infection that occurs when the normal balance of bacteria in the vagina is disrupted. °· Because bacterial vaginosis increases your risk for STIs (sexually transmitted infections), getting treated can help reduce your risk for chlamydia, gonorrhea, herpes, and HIV (human immunodeficiency virus). Treatment is also important for preventing complications in pregnant women, because the condition can cause an early (premature) delivery. °· This condition is treated with antibiotic medicines. These may be given as a pill, a vaginal cream, or a medicine that is put into the vagina (suppository). °This information is not intended to replace advice given to you by your health care provider. Make sure you discuss any questions you have with your health care provider. °Document Released: 11/01/2005 Document Revised: 03/07/2017 Document Reviewed: 07/17/2016 °Elsevier Interactive Patient Education © 2018 [employer] ° °

## 2017-11-25 ENCOUNTER — Other Ambulatory Visit: Payer: Self-pay

## 2017-11-25 LAB — URINE CULTURE: Culture: >=100000 — AB

## 2017-11-29 ENCOUNTER — Telehealth: Payer: Self-pay

## 2017-11-29 DIAGNOSIS — B3731 Acute candidiasis of vulva and vagina: Secondary | ICD-10-CM

## 2017-11-29 DIAGNOSIS — B373 Candidiasis of vulva and vagina: Secondary | ICD-10-CM

## 2017-11-29 MED ORDER — FLUCONAZOLE 150 MG PO TABS: 150.0000 mg | ORAL_TABLET | Freq: Once | ORAL | 1 refills | Status: AC

## 2017-11-29 NOTE — Telephone Encounter (Signed)
Patient called requesting medication for yeast infection after taking antibiotics. RX sent to patient's pharmacy.  Christina Armstrong, CNM 11/29/17 10:05 PM

## 2017-12-30 IMAGING — CT CT HEAD W/O CM
3 of 6 series · 15 of 47 positions shown, 18 images · non-contrast
Comparison: 02/12/2009 CT head.

CLINICAL DATA: 41 y/o F; dizziness, left-sided headache, pressure
behind left eye, and facial numbness.

EXAM:
CT HEAD WITHOUT CONTRAST
CT MAXILLOFACIAL WITHOUT CONTRAST
TECHNIQUE: Multidetector CT imaging of the head and maxillofacial structures
were performed using the standard protocol without intravenous
contrast. Multiplanar CT image reconstructions of the maxillofacial
structures were also generated.

[Series 4: cor head wo · coronal · 0.30mm/px · 3 of 84 slices shown]
[im 21/84  brain]
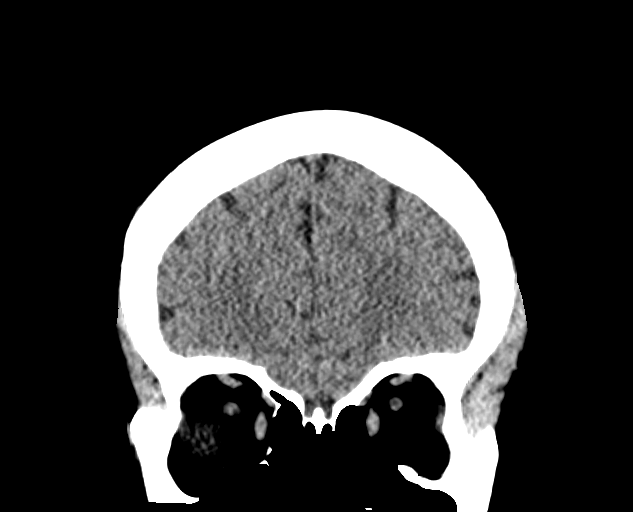
[im 42/84  brain]
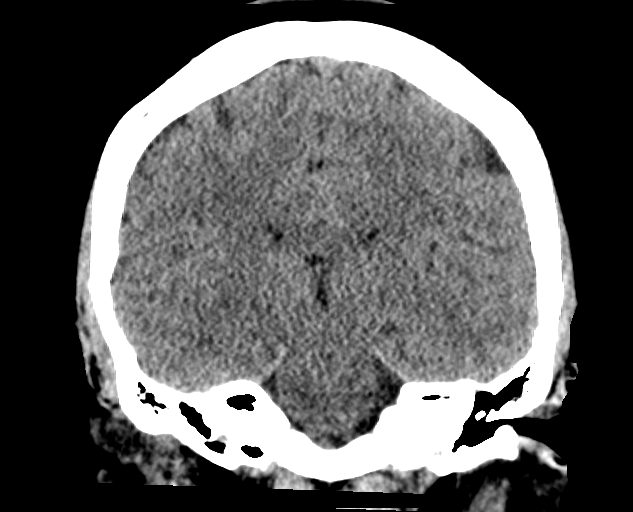
[im 63/84  brain]
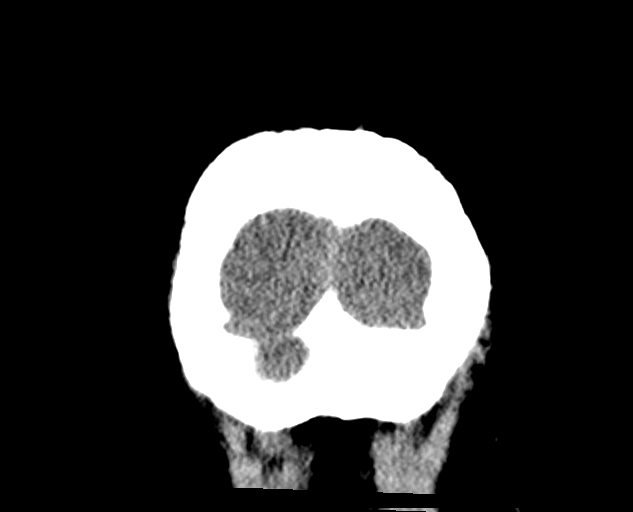

[Series 6: max soft · axial · 0.39mm/px · z∈[-218,-72]mm · 9 of 91 slices shown, 12 images]
[im 9/91  brain]
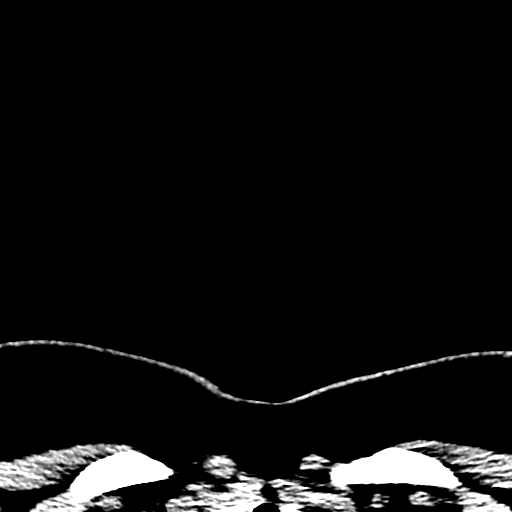
[im 9/91  bone]
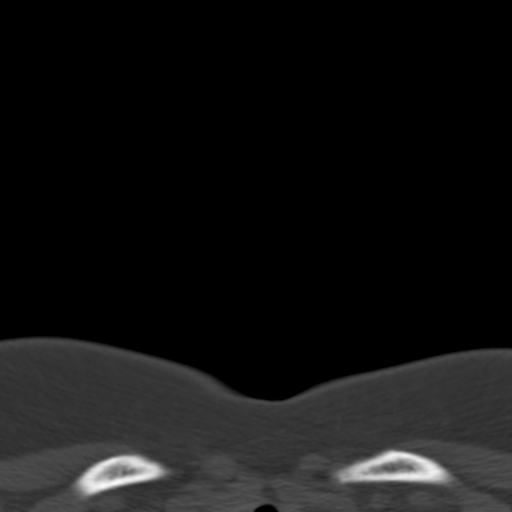
[im 18/91  brain]
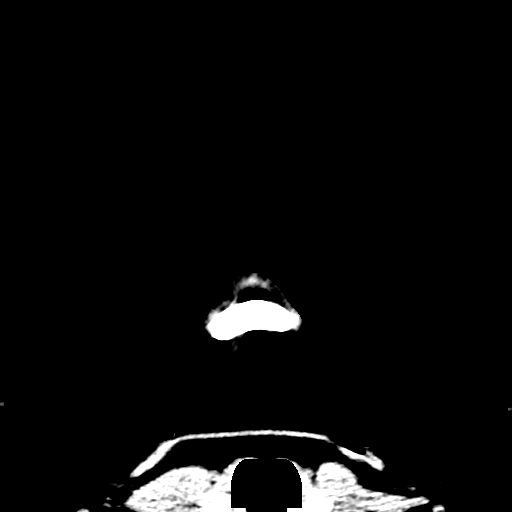
[im 26/91  brain]
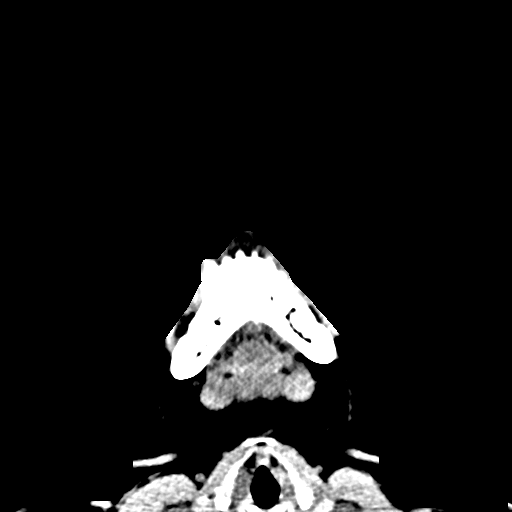
[im 35/91  brain]
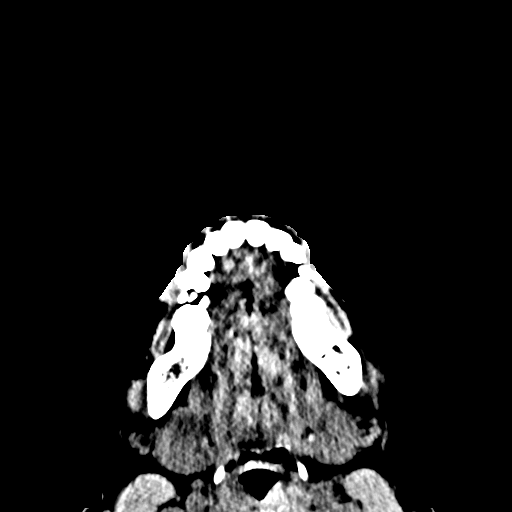
[im 48/91  brain]
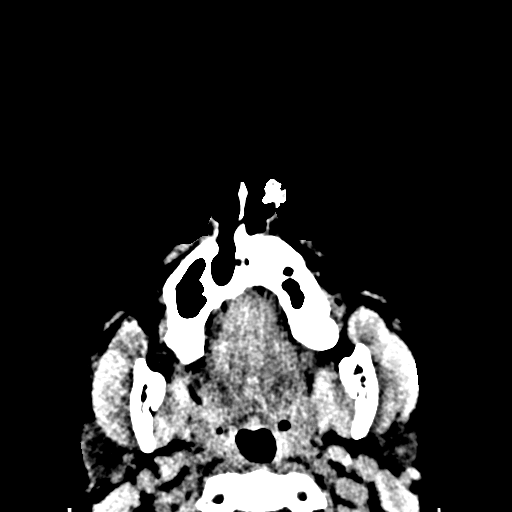
[im 48/91  bone]
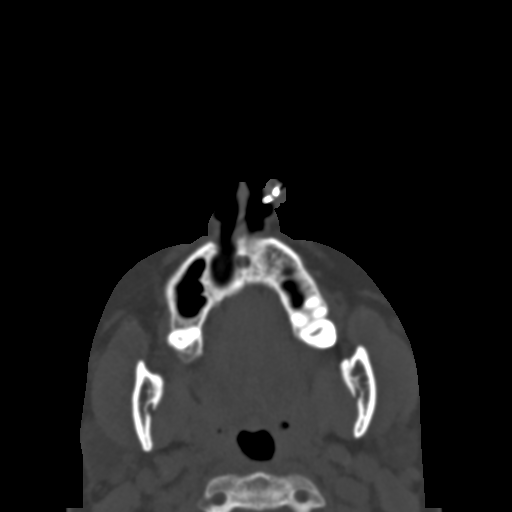
[im 56/91  brain]
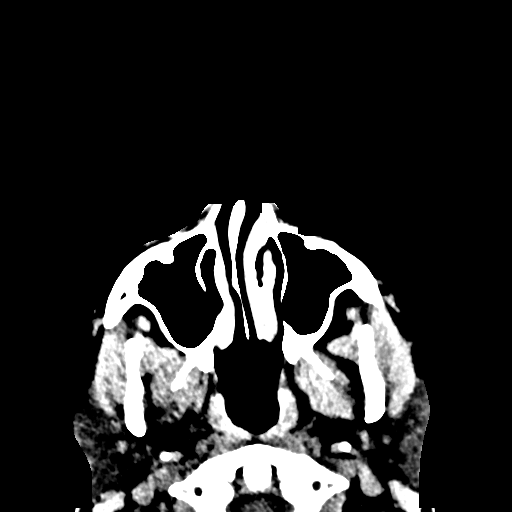
[im 65/91  brain]
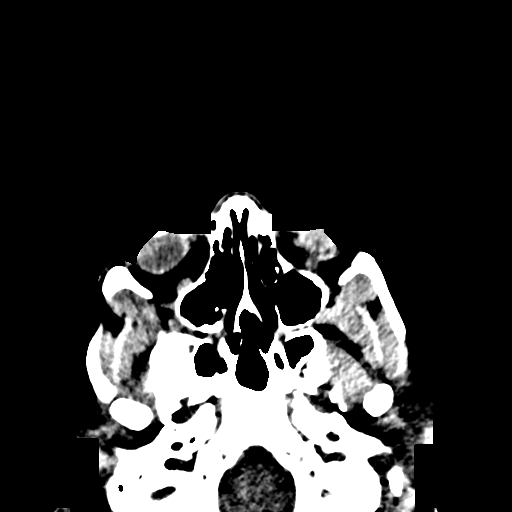
[im 73/91  brain]
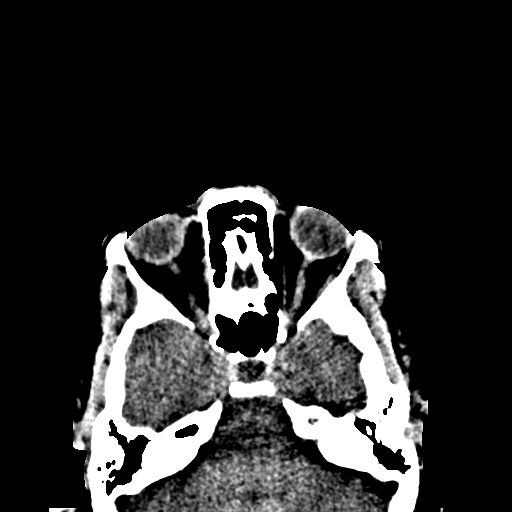
[im 82/91  brain]
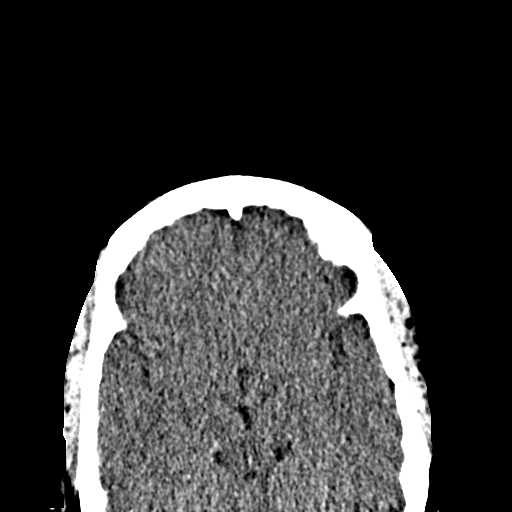
[im 82/91  bone]
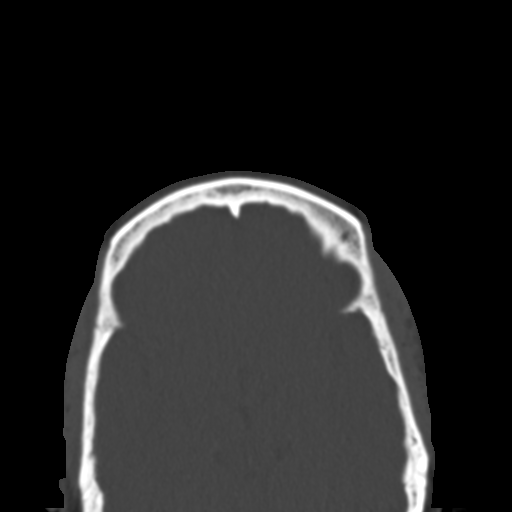

[Series 11: sagittal soft · sagittal · 0.34mm/px · 3 of 115 slices shown]
[im 29/115  brain]
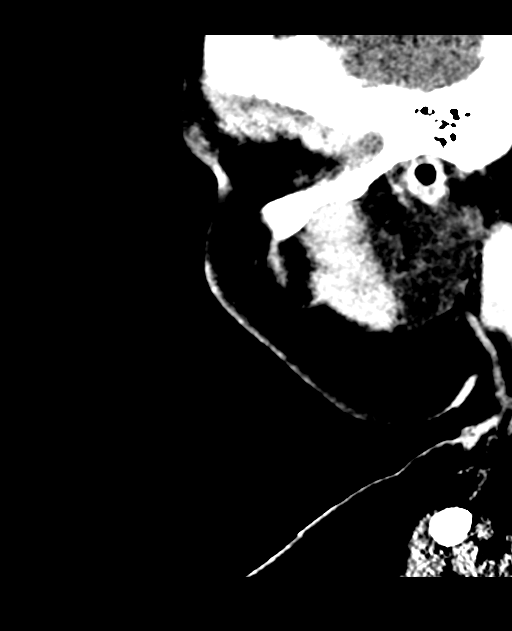
[im 58/115  brain]
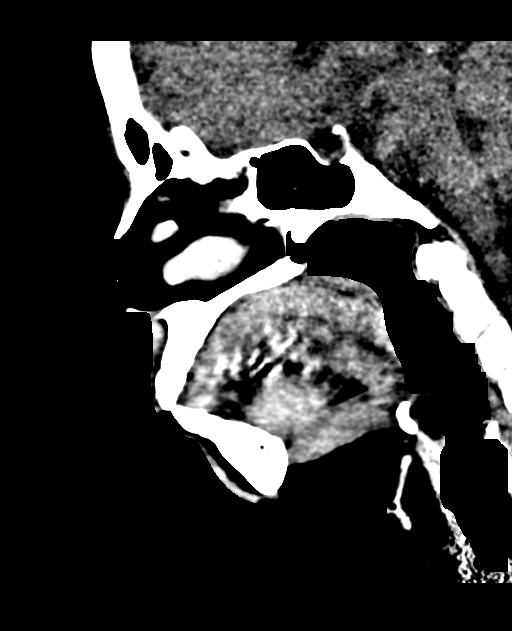
[im 86/115  brain]
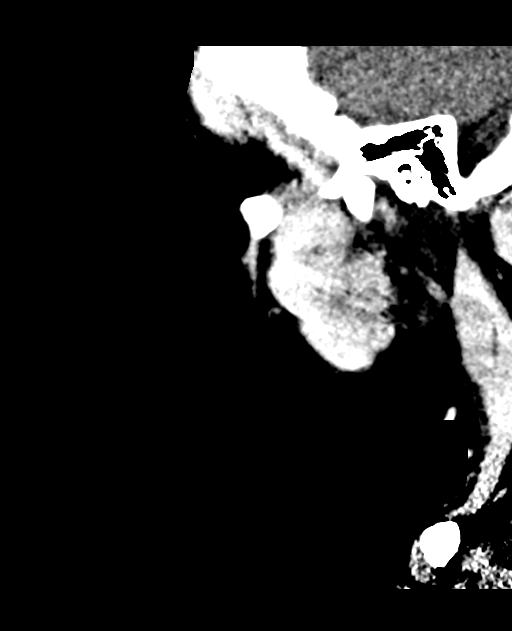

[15 of 47 positions shown; findings below may reference images not displayed]

FINDINGS: CT HEAD FINDINGS

Brain: No evidence of acute infarction, hemorrhage, hydrocephalus,
extra-axial collection or mass lesion/mass effect.

Vascular: No hyperdense vessel or unexpected calcification.

Skull: Normal. Negative for fracture or focal lesion.

Other: None.

CT MAXILLOFACIAL FINDINGS

Osseous: No fracture or mandibular dislocation. No destructive
process.

Orbits: Negative. No traumatic or inflammatory finding.

Sinuses: Clear.

Soft tissues: Negative.
IMPRESSION: 1. Normal CT of head.
2. Normal maxillofacial CT.

By: Maeph Akira M.D.

## 2018-01-11 ENCOUNTER — Other Ambulatory Visit: Payer: Self-pay

## 2018-01-11 ENCOUNTER — Emergency Department (HOSPITAL_BASED_OUTPATIENT_CLINIC_OR_DEPARTMENT_OTHER)
Admission: EM | Admit: 2018-01-11 | Discharge: 2018-01-11 | Disposition: A | Discharge status: Home / Self Care | Payer: Medicaid Other | Attending: Emergency Medicine | Admitting: Emergency Medicine

## 2018-01-11 ENCOUNTER — Encounter (HOSPITAL_BASED_OUTPATIENT_CLINIC_OR_DEPARTMENT_OTHER): Payer: Self-pay | Admitting: *Deleted

## 2018-01-11 DIAGNOSIS — J45909 Unspecified asthma, uncomplicated: Secondary | ICD-10-CM | POA: Insufficient documentation

## 2018-01-11 DIAGNOSIS — R42 Dizziness and giddiness: Secondary | ICD-10-CM | POA: Insufficient documentation

## 2018-01-11 LAB — CBC WITH DIFFERENTIAL/PLATELET
BASOS PCT: 0 %
Basophils Absolute: 0 10*3/uL (ref 0.0–0.1)
Eosinophils Absolute: 0.1 10*3/uL (ref 0.0–0.7)
Eosinophils Relative: 1 %
HCT: 28.4 % — ABNORMAL LOW (ref 36.0–46.0)
Hemoglobin: 9 g/dL — ABNORMAL LOW (ref 12.0–15.0)
LYMPHS ABS: 2.1 10*3/uL (ref 0.7–4.0)
Lymphocytes Relative: 40 %
MCH: 21.7 pg — ABNORMAL LOW (ref 26.0–34.0)
MCHC: 31.7 g/dL (ref 30.0–36.0)
MCV: 68.4 fL — ABNORMAL LOW (ref 78.0–100.0)
MONO ABS: 0.7 10*3/uL (ref 0.1–1.0)
Monocytes Relative: 13 %
NEUTROS ABS: 2.3 10*3/uL (ref 1.7–7.7)
Neutrophils Relative %: 46 %
PLATELETS: 347 10*3/uL (ref 150–400)
RBC: 4.15 MIL/uL (ref 3.87–5.11)
RDW: 18 % — ABNORMAL HIGH (ref 11.5–15.5)
WBC: 5.2 10*3/uL (ref 4.0–10.5)

## 2018-01-11 LAB — COMPREHENSIVE METABOLIC PANEL
ALT: 15 U/L (ref 14–54)
ANION GAP: 6 (ref 5–15)
AST: 16 U/L (ref 15–41)
Albumin: 3.6 g/dL (ref 3.5–5.0)
Alkaline Phosphatase: 64 U/L (ref 38–126)
BUN: 9 mg/dL (ref 6–20)
CHLORIDE: 105 mmol/L (ref 101–111)
CO2: 26 mmol/L (ref 22–32)
Calcium: 8.6 mg/dL — ABNORMAL LOW (ref 8.9–10.3)
Creatinine, Ser: 0.56 mg/dL (ref 0.44–1.00)
GFR calc Af Amer: >60 mL/min (ref >60)
GFR calc non Af Amer: >60 mL/min (ref >60)
Glucose, Bld: 86 mg/dL (ref 65–99)
Potassium: 3.1 mmol/L — ABNORMAL LOW (ref 3.5–5.1)
SODIUM: 137 mmol/L (ref 135–145)
Total Bilirubin: 0.1 mg/dL — ABNORMAL LOW (ref 0.3–1.2)
Total Protein: 7.2 g/dL (ref 6.5–8.1)

## 2018-01-11 LAB — URINALYSIS, ROUTINE W REFLEX MICROSCOPIC
Bilirubin Urine: NEGATIVE
GLUCOSE, UA: NEGATIVE mg/dL
HGB URINE DIPSTICK: NEGATIVE
KETONES UR: NEGATIVE mg/dL
Leukocytes, UA: NEGATIVE
Nitrite: NEGATIVE
Protein, ur: NEGATIVE mg/dL
Specific Gravity, Urine: <1.005 — ABNORMAL LOW (ref 1.005–1.030)
pH: 7 (ref 5.0–8.0)

## 2018-01-11 LAB — PREGNANCY, URINE: Preg Test, Ur: NEGATIVE

## 2018-01-11 MED ORDER — POTASSIUM CHLORIDE CRYS ER 20 MEQ PO TBCR
40.0000 meq | EXTENDED_RELEASE_TABLET | Freq: Once | ORAL | Status: AC
  Administered 2018-01-11: 40 meq via ORAL
  Filled 2018-01-11: qty 2

## 2018-01-11 NOTE — ED Triage Notes (Addendum)
Pt c/o sudden onset of dizziness  And h/a  X 1 hr, increased stress

## 2018-01-11 NOTE — Discharge Instructions (Addendum)
Christina Armstrong,  It sound likes you had a near syncopal episode. You had a normal EKG, but given you heart history, it would be good to follow-up with cardiology for continued monitoring of your mitral valve prolapse. Your electrolytes were normal aside from potassium, which was a little low. Your hemoglobin was 9.0, so you are anemic but better than when measured in the past. Please see your regular doctor to discuss alternatives for iron repletion. Please drink plenty of fluids and try to get good sleep.

## 2018-01-11 NOTE — ED Provider Notes (Signed)
[facility] EMERGENCY DEPARTMENT Provider Note   CSN: [id number] Arrival date & time: 01/11/18  1614     History   Chief Complaint Chief Complaint  Patient presents with  . Dizziness    HPI Christina Armstrong is a 42 y.o. female with history of mitral valve prolapse and anemia who presents for dizzy spell at work today.   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 She says she felt off balance but room was not actually spinning and no change in vision. She sat down, and symptoms did not really improve. No LOC. She had a sensation of warmth from head to toe with episode. She was going to drive home but "felt funny" and decided to come to ED. She was seen in November for left-sided headache with sensation of left facial "heaviness" and has had this sensation most days since--was thought to have trigeminal neuralgia or cluster headache at the time. She denies fever but has been getting over a cold (cough and congestion). She reports not getting much sleep last night and that she does not do well without rest.   Denies chest pain or shortness of breath. She does note increased fatigue and feels like it is harder to find the energy to get out of bed.   She reports history of anemia with hemoglobin of "6" in the past but could not tolerate PO iron due to constipation so is not taking this. Denies heavy periods.   Past Medical History:  Diagnosis Date  . Anxiety   . Asthma    seasonal bronchitis  . MVP (mitral valve prolapse)   . Panic attacks     There are no active problems to display for this patient.   Past Surgical History:  Procedure Laterality Date  . CESAREAN SECTION    . TUBAL LIGATION      OB History    Gravida Para Term Preterm AB Living   5 4 4  0 1 4   SAB TAB Ectopic Multiple Live Births   1 0 0 0 4       Home Medications    Prior to  Admission medications   Not on File    Family History Family History  Problem Relation Age of Onset  . Other Neg Hx     Social History Social History   Tobacco Use  . Smoking status: Never Smoker  . Smokeless tobacco: Never Used  Substance Use Topics  . Alcohol use: Yes    Comment: occasional alcohol  . Drug use: No     Allergies   Ciprofloxacin and Codeine   Review of Systems Review of Systems  Constitutional: Positive for fever. Negative for appetite change.  HENT: Positive for congestion and postnasal drip.   Eyes: Negative for pain and visual disturbance.  Respiratory: Negative for shortness of breath.   Cardiovascular: Negative for chest pain and leg swelling.  Gastrointestinal: Negative for abdominal pain, nausea and vomiting.  Genitourinary: Negative for dysuria and urgency.  Musculoskeletal: Negative for myalgias.  Skin: Negative for rash.  Neurological: Positive for headaches. Negative for syncope.  Psychiatric/Behavioral: Negative for confusion.     Physical Exam Updated Vital Signs BP 111/85 (BP Location: Left Arm)   Pulse 79   Temp 98.1 F (36.7 C) (Oral)   Resp 16   Ht 5\' 2"  (1.575 m)   Wt  105.2 kg (232 lb)   LMP 12/28/2017   SpO2 100%   BMI 42.43 kg/m   Physical Exam  Constitutional: She is oriented to person, place, and time. She appears well-developed.  Obese, pleasant  HENT:  Head: Normocephalic and atraumatic.  Right Ear: External ear normal.  Left Ear: External ear normal.  Mouth/Throat: Oropharynx is clear and moist.  Mild nasal congestion  Eyes: Conjunctivae and EOM are normal. Pupils are equal, round, and reactive to light.  Neck: Normal range of motion. Neck supple.  Cardiovascular: Normal rate, regular rhythm and normal heart sounds.  No murmur heard. Pulmonary/Chest: No respiratory distress. She exhibits no tenderness.  Abdominal: Soft. Bowel sounds are normal. There is no tenderness.  Musculoskeletal: Normal range of  motion. She exhibits no edema.  Neurological: She is alert and oriented to person, place, and time. She displays normal reflexes. No cranial nerve deficit or sensory deficit. Coordination normal.  Skin: Skin is warm and dry. No rash noted.  Psychiatric: She has a normal mood and affect.  Very talkative  Nursing note and vitals reviewed.    ED Treatments / Results  Labs (all labs ordered are listed, but only abnormal results are displayed) Labs Reviewed  URINALYSIS, ROUTINE W REFLEX MICROSCOPIC - Abnormal; Notable for the following components:      Result Value   Specific Gravity, Urine <1.005 (*)    All other components within normal limits  CBC WITH DIFFERENTIAL/PLATELET - Abnormal; Notable for the following components:   Hemoglobin 9.0 (*)    HCT 28.4 (*)    MCV 68.4 (*)    MCH 21.7 (*)    RDW 18.0 (*)    All other components within normal limits  COMPREHENSIVE METABOLIC PANEL - Abnormal; Notable for the following components:   Potassium 3.1 (*)    Calcium 8.6 (*)    Total Bilirubin 0.1 (*)    All other components within normal limits  PREGNANCY, URINE    EKG  EKG Interpretation  Date/Time:  Wednesday January 11 2018 17:21:02 EST Ventricular Rate:  70 PR Interval:    QRS Duration: 85 QT Interval:  391 QTC Calculation: 422 R Axis:   26 Text Interpretation:  Sinus rhythm Baseline wander in lead(s) V1 V2 No STEMI.  Confirmed by Alona BeneLong, Joshua [phone]) on 01/11/2018 5:25:11 PM Also confirmed by Alona BeneLong, Joshua [phone]), editor Elita QuickWatlington, Beverly ([id number])  on 01/12/2018 7:43:12 AM       Radiology No results found.  Procedures Procedures (including critical care time)  Medications Ordered in ED Medications  potassium chloride SA (K-DUR,KLOR-CON) CR tablet 40 mEq (40 mEq Oral Given 01/11/18 1844)     Initial Impression / Assessment and Plan / ED Course  I have reviewed the triage vital signs and the nursing notes.  Pertinent labs & imaging results that were available  during my care of the patient were reviewed by me and considered in my medical decision making (see chart for details).  Patient well appearing with normal vital signs.  Normal EKG and no loss of consciousness, so arrhythmia as cause of episode less likely. With history of MVP, however, recommended patient follow-up with her Cardiologist for routine monitoring. No confusion or worsening of chronic headaches to suggest seizure or complex migraine.  Hemoglobin is improved from prior results, so do not think symptoms caused by symptomatic anemia.  Potassium somewhat low so repleted this.   Final Clinical Impressions(s) / ED Diagnoses   Final diagnoses:  Dizziness   Recommended patient drink  plenty of fluids and get more sleep. Counseled to follow-up with her regular doctor to discuss treatment of chronic anemia. Recommended follow-up appointment with Cardiology for MVP, as patient says she is overdue for monitoring ECHO.   ED Discharge Orders    None       Dani Gobble [place], MD 01/12/18 1914    Maia Plan, MD 01/12/18 [phone]

## 2018-01-11 NOTE — ED Notes (Signed)
ED Provider at bedside. 

## 2018-01-11 NOTE — ED Provider Notes (Incomplete)
[facility] EMERGENCY DEPARTMENT Provider Note   CSN: [id number] Arrival date & time: 01/11/18  1614     History   Chief Complaint Chief Complaint  Patient presents with  . Dizziness    HPI Christina Armstrong Donegan is a 42 y.o. female with history of mitral valve prolapse and anemia who presents for dizzy spell at work today.   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 She says she felt off balance but room was not actually spinning and no change in vision. She sat down, and symptoms did not really improve. No LOC. She had a sensation of warmth from head to toe with episode. She was going to drive home but "felt funny" and decided to come to ED. She was seen in November for left-sided headache with sensation of left facial "heaviness" and has had this sensation most days since--was thought to have trigeminal neuralgia or cluster headache at the time. She denies fever but has been getting over a cold (cough and congestion). She reports not getting much sleep last night and that she does not do well without rest.   Denies chest pain or shortness of breath. She does note increased fatigue and feels like it is harder to find the energy to get out of bed.     Past Medical History:  Diagnosis Date  . Anxiety   . Asthma    seasonal bronchitis  . MVP (mitral valve prolapse)   . Panic attacks     There are no active problems to display for this patient.   Past Surgical History:  Procedure Laterality Date  . CESAREAN SECTION    . TUBAL LIGATION      OB History    Gravida Para Term Preterm AB Living   5 4 4  0 1 4   SAB TAB Ectopic Multiple Live Births   1 0 0 0 4       Home Medications    Prior to Admission medications   Not on File    Family History Family History  Problem Relation Age of Onset  . Other Neg Hx     Social History Social History    Tobacco Use  . Smoking status: Never Smoker  . Smokeless tobacco: Never Used  Substance Use Topics  . Alcohol use: Yes    Comment: occasional alcohol  . Drug use: No     Allergies   Ciprofloxacin and Codeine   Review of Systems Review of Systems   Physical Exam Updated Vital Signs BP 121/66   Pulse 85   Temp 98.1 F (36.7 C) (Oral)   Resp 16   Ht 5\' 2"  (1.575 m)   Wt 105.2 kg (232 lb)   LMP 12/28/2017   SpO2 100%   BMI 42.43 kg/m   Physical Exam   ED Treatments / Results  Labs (all labs ordered are listed, but only abnormal results are displayed) Labs Reviewed  URINALYSIS, ROUTINE W REFLEX MICROSCOPIC - Abnormal; Notable for the following components:      Result Value   Specific Gravity, Urine <1.005 (*)    All other components within normal limits  PREGNANCY, URINE    EKG  EKG Interpretation None       Radiology No results found.  Procedures Procedures (including critical care time)  Medications Ordered in ED Medications - No data to display  Initial Impression / Assessment and Plan / ED Course  I have reviewed the triage vital signs and the nursing notes.  Pertinent labs & imaging results that were available during my care of the patient were reviewed by me and considered in my medical decision making (see chart for details).     ***  Final Clinical Impressions(s) / ED Diagnoses   Final diagnoses:  None    ED Discharge Orders    None

## 2018-06-13 ENCOUNTER — Emergency Department (HOSPITAL_COMMUNITY)
Admission: EM | Admit: 2018-06-13 | Discharge: 2018-06-14 | Discharge status: Against Medical Advice | Payer: Medicaid Other

## 2018-06-13 NOTE — ED Triage Notes (Signed)
Pt LWBS Pt stated that since arriving she "felt better and probably just needed to get some rest." Pt V/S/S  Pt instructed to come back to ED if needed

## 2018-07-07 ENCOUNTER — Emergency Department (HOSPITAL_BASED_OUTPATIENT_CLINIC_OR_DEPARTMENT_OTHER)
Admission: EM | Admit: 2018-07-07 | Discharge: 2018-07-07 | Disposition: A | Discharge status: Home / Self Care | Payer: Medicaid Other | Attending: Emergency Medicine | Admitting: Emergency Medicine

## 2018-07-07 ENCOUNTER — Other Ambulatory Visit: Payer: Self-pay

## 2018-07-07 ENCOUNTER — Encounter (HOSPITAL_BASED_OUTPATIENT_CLINIC_OR_DEPARTMENT_OTHER): Payer: Self-pay | Admitting: *Deleted

## 2018-07-07 DIAGNOSIS — X500XXA Overexertion from strenuous movement or load, initial encounter: Secondary | ICD-10-CM | POA: Insufficient documentation

## 2018-07-07 DIAGNOSIS — J45909 Unspecified asthma, uncomplicated: Secondary | ICD-10-CM | POA: Insufficient documentation

## 2018-07-07 DIAGNOSIS — S29019A Strain of muscle and tendon of unspecified wall of thorax, initial encounter: Secondary | ICD-10-CM

## 2018-07-07 DIAGNOSIS — Y9389 Activity, other specified: Secondary | ICD-10-CM | POA: Insufficient documentation

## 2018-07-07 DIAGNOSIS — Y999 Unspecified external cause status: Secondary | ICD-10-CM | POA: Insufficient documentation

## 2018-07-07 DIAGNOSIS — Y929 Unspecified place or not applicable: Secondary | ICD-10-CM | POA: Insufficient documentation

## 2018-07-07 MED ORDER — METHOCARBAMOL 500 MG PO TABS: 500.0000 mg | ORAL_TABLET | Freq: Three times a day (TID) | ORAL | 0 refills | Status: DC | PRN

## 2018-07-07 MED ORDER — IBUPROFEN 800 MG PO TABS: 800.0000 mg | ORAL_TABLET | Freq: Three times a day (TID) | ORAL | 0 refills | Status: DC | PRN

## 2018-07-07 MED FILL — METHOCARBAMOL 500 MG TABLET: 500 | 7 days supply | Qty: 20 | Fill #0

## 2018-07-07 MED FILL — IBUPROFEN 800 MG TAB: 800 | 7 days supply | Qty: 21 | Fill #0

## 2018-07-07 NOTE — Discharge Instructions (Signed)

## 2018-07-07 NOTE — ED Provider Notes (Signed)
Emergency Department Provider Note   I have reviewed the triage vital signs and the nursing notes.   HISTORY  Chief Complaint Back Pain   HPI Christina Armstrong is a 42 y.o. female with PMH of asthma's to the emergency department for evaluation of pain in the mid back radiating between the shoulders.  Patient states that she moved a mattress yesterday but did not have any pain during this event.  She woke up this morning pain-free and then developed a soreness on either side of her middle back.  She states it is worse with movement or bending over.  She denies any radiation of pain down the arms states the pain does radiate to the shoulders.  No chest pain or shortness of breath.  No unilateral weakness or numbness.  Patient denies any midline back pain.  No leg weakness or numbness.  Past Medical History:  Diagnosis Date  . Anxiety   . Asthma    seasonal bronchitis  . MVP (mitral valve prolapse)   . Panic attacks     There are no active problems to display for this patient.   Past Surgical History:  Procedure Laterality Date  . CESAREAN SECTION    . TUBAL LIGATION      Allergies Ciprofloxacin and Codeine  Family History  Problem Relation Age of Onset  . Other Neg Hx     Social History Social History   Tobacco Use  . Smoking status: Never Smoker  . Smokeless tobacco: Never Used  Substance Use Topics  . Alcohol use: Yes    Comment: occasional alcohol  . Drug use: No    Review of Systems  Constitutional: No fever/chills Eyes: No visual changes. ENT: No sore throat. Cardiovascular: Denies chest pain. Respiratory: Denies shortness of breath. Gastrointestinal: No abdominal pain.  No nausea, no vomiting.  No diarrhea.  No constipation. Genitourinary: Negative for dysuria. Musculoskeletal: Positive thoracic back pain.  Skin: Negative for rash. Neurological: Negative for headaches, focal weakness or numbness.  10-point ROS otherwise  negative.  ____________________________________________   PHYSICAL EXAM:  VITAL SIGNS: ED Triage Vitals  Enc Vitals Group     BP 07/07/18 1102 (!) 140/100     Pulse Rate 07/07/18 1102 (!) 58     Resp 07/07/18 1102 16     Temp 07/07/18 1102 98.9 F (37.2 C)     Temp Source 07/07/18 1102 Oral     SpO2 07/07/18 1102 100 %     Weight 07/07/18 1102 233 lb (105.7 kg)     Height 07/07/18 1102 5\' 1"  (1.549 m)     Pain Score 07/07/18 1106 5   Constitutional: Alert and oriented. Well appearing and in no acute distress. Eyes: Conjunctivae are normal.  Head: Atraumatic. Nose: No congestion/rhinnorhea. Mouth/Throat: Mucous membranes are moist.  Neck: No stridor. No cervical spine tenderness to palpation. Cardiovascular:  Good peripheral circulation. Grossly normal heart sounds.   Respiratory: Normal respiratory effort.  No retractions. Lungs CTAB. Gastrointestinal: Soft and nontender. No distention.  Musculoskeletal: No lower extremity tenderness nor edema. No gross deformities of extremities. No midline thoracic or lumbar spine tenderness. Positive paraspinal tenderness in the thoracic region.  Neurologic:  Normal speech and language. No gross focal neurologic deficits are appreciated.  Skin:  Skin is warm, dry and intact. No rash noted.  ____________________________________________  RADIOLOGY  None ____________________________________________   PROCEDURES  Procedure(s) performed:   Procedures  None ____________________________________________   INITIAL IMPRESSION / ASSESSMENT AND PLAN / ED COURSE  Pertinent labs & imaging results that were available during my care of the patient were reviewed by me and considered in my medical decision making (see chart for details).  Patient has bilateral paraspinal tenderness in the upper thoracic region.  She has no midline cervical or thoracic spine tenderness to palpation.  No neurological deficits.  No injuries.  Extremely low  suspicion for atypical ACS.  Normal vital signs.  Do not feel the patient requires any imaging at this time.  She has had similar pain in the past was secondary to back spasms.  Plan to treat with Motrin, Robaxin, and warm compress.   At this time, I do not feel there is any life-threatening condition present. I have reviewed and discussed all results (EKG, imaging, lab, urine as appropriate), exam findings with patient. I have reviewed nursing notes and appropriate previous records.  I feel the patient is safe to be discharged home without further emergent workup. Discussed usual and customary return precautions. Patient and family (if present) verbalize understanding and are comfortable with this plan.  Patient will follow-up with their primary care provider. If they do not have a primary care provider, information for follow-up has been provided to them. All questions have been answered.    ____________________________________________  FINAL CLINICAL IMPRESSION(S) / ED DIAGNOSES  Final diagnoses:  Thoracic myofascial strain, initial encounter     NEW OUTPATIENT MEDICATIONS STARTED DURING THIS VISIT:  Discharge Medication List as of 07/07/2018 11:48 AM    START taking these medications   Details  ibuprofen (ADVIL,MOTRIN) 800 MG tablet Take 1 tablet (800 mg total) by mouth every 8 (eight) hours as needed., Starting Fri 07/07/2018, Print    methocarbamol (ROBAXIN) 500 MG tablet Take 1 tablet (500 mg total) by mouth every 8 (eight) hours as needed., Starting Fri 07/07/2018, Print        Note:  This document was prepared using Dragon voice recognition software and may include unintentional dictation errors.  Alona Bene, MD Emergency Medicine    Selmon, Arlyss Repress, MD 07/07/18 [phone]

## 2018-07-07 NOTE — ED Triage Notes (Signed)
Back pain after getting out of the bed wrong and reaching down to dry her feet after her shower. States she been having spasms that comes 2 times a year for the past years.

## 2019-05-07 ENCOUNTER — Other Ambulatory Visit: Payer: Self-pay | Admitting: *Deleted

## 2019-05-07 DIAGNOSIS — Z20822 Contact with and (suspected) exposure to covid-19: Secondary | ICD-10-CM

## 2019-05-10 NOTE — Addendum Note (Signed)
Addended by: Jaystin Mcgarvey M on: 05/10/2019 03:58 PM   Modules accepted: Orders  

## 2019-09-19 ENCOUNTER — Other Ambulatory Visit: Payer: Self-pay

## 2019-09-19 DIAGNOSIS — Z20822 Contact with and (suspected) exposure to covid-19: Secondary | ICD-10-CM

## 2019-09-20 LAB — NOVEL CORONAVIRUS, NAA: SARS-CoV-2, NAA: DETECTED — AB

## 2019-09-24 ENCOUNTER — Telehealth: Payer: Self-pay

## 2019-09-24 NOTE — Telephone Encounter (Signed)
The pt called and said that she needed a note and flma filled out for her job because she is positive for the coronavirus.  I notified the pt that I was told that she would be considered a new patient and that the office doesn't do virtual appointments for new patients.

## 2020-01-29 ENCOUNTER — Other Ambulatory Visit: Payer: Self-pay

## 2020-01-29 ENCOUNTER — Encounter (HOSPITAL_COMMUNITY): Payer: Self-pay | Admitting: Emergency Medicine

## 2020-01-29 ENCOUNTER — Emergency Department (HOSPITAL_COMMUNITY)
Admission: EM | Admit: 2020-01-29 | Discharge: 2020-01-29 | Disposition: A | Discharge status: Home / Self Care | Payer: Medicaid Other | Attending: Emergency Medicine | Admitting: Emergency Medicine

## 2020-01-29 DIAGNOSIS — M436 Torticollis: Secondary | ICD-10-CM | POA: Insufficient documentation

## 2020-01-29 DIAGNOSIS — R519 Headache, unspecified: Secondary | ICD-10-CM | POA: Insufficient documentation

## 2020-01-29 DIAGNOSIS — Z79899 Other long term (current) drug therapy: Secondary | ICD-10-CM | POA: Insufficient documentation

## 2020-01-29 DIAGNOSIS — J45909 Unspecified asthma, uncomplicated: Secondary | ICD-10-CM | POA: Insufficient documentation

## 2020-01-29 MED ORDER — DIAZEPAM 2 MG PO TABS: 2.0000 mg | ORAL_TABLET | Freq: Three times a day (TID) | ORAL | 0 refills | Status: AC | PRN

## 2020-01-29 MED ORDER — KETOROLAC TROMETHAMINE 60 MG/2ML IM SOLN
60.0000 mg | Freq: Once | INTRAMUSCULAR | Status: AC
  Administered 2020-01-29: 60 mg via INTRAMUSCULAR
  Filled 2020-01-29: qty 2

## 2020-01-29 NOTE — Discharge Instructions (Signed)
Thank you for allowing [place] to take care of you today.  Below is a summary of what we discussed:  1.  Neck pain - I think your neck pain and headaches is being caused by a muscle spasm in your neck.  We gave you a shot of Toradol, and a short course of Valium to help manage the pain.  Take the Valium as needed.  Continue taking ibuprofen, just make sure you take it with food.  2.  Follow-up -Please follow-up with your primary care provider as soon as you establish one

## 2020-01-29 NOTE — ED Provider Notes (Signed)
[name]-EMERGENCY DEPT Provider Note   CSN: [id number] Arrival date & time: 01/29/20  1959     History Chief Complaint  Patient presents with  . Neck Pain  . Headache   Christina Armstrong is a 44 y.o. female.  Christina Armstrong is a 44 year old female with a past medical history of asthma, mitral valve prolapse and anxiety who presents with left-sided neck pain.  The patient states that this has been an ongoing problem for her for the past year.  The last exacerbation of this pain led to an ED visit in which a CT of the head was performed, and was negative.  She was given a shot of Toradol and subsequently discharged.  The patient is experiencing the same symptoms today.  She states that the pain is sharp, and is 8/10 in severity.  She states it started last Saturday when she traveled out of town and it woke her up from her sleep.  The pain seems to radiate to the back of her head.  She denies any photophobia, phonophobia, or nausea or vomiting with the headaches. No history of migraines noted. She states that ibuprofen decreases the pain to 4/10.  She is also tried icy hot patches with minimal relief.  She states the pain is worse at night before going to bed, and in the morning after she wakes up but improves throughout the day.  She endorses some mild central chest pain that is nonradiating.  She denies any numbness or tingling in any extremities or focal weaknesses that she can identify.       Past Medical History:  Diagnosis Date  . Anxiety   . Asthma    seasonal bronchitis  . MVP (mitral valve prolapse)   . Panic attacks    There are no problems to display for this patient.  Past Surgical History:  Procedure Laterality Date  . CESAREAN SECTION    . TUBAL LIGATION      OB History    Gravida  5   Para  4   Term  4   Preterm  0   AB  1   Living  4     SAB  1   TAB  0   Ectopic  0   Multiple  0   Live Births  4          Family History    Problem Relation Age of Onset  . Other Neg Hx    Social History   Tobacco Use  . Smoking status: Never Smoker  . Smokeless tobacco: Never Used  Substance Use Topics  . Alcohol use: Yes    Comment: occasional alcohol  . Drug use: No   Home Medications Prior to Admission medications   Medication Sig Start Date End Date Taking? Authorizing Provider  ibuprofen (ADVIL,MOTRIN) 800 MG tablet Take 1 tablet (800 mg total) by mouth every 8 (eight) hours as needed. 07/07/18   Canty, Arlyss Repress, MD  methocarbamol (ROBAXIN) 500 MG tablet Take 1 tablet (500 mg total) by mouth every 8 (eight) hours as needed. 07/07/18   Maille, Arlyss Repress, MD   Allergies    Ciprofloxacin and Codeine  Review of Systems   Review of Systems  Constitutional: Negative.   HENT: Negative.   Eyes: Negative.   Respiratory: Negative.   Cardiovascular: Positive for chest pain.  Gastrointestinal: Negative.   Endocrine: Negative.   Genitourinary: Negative.   Musculoskeletal: Negative.   Skin: Negative.  Allergic/Immunologic: Negative.   Neurological: Positive for headaches (back of headache).  Hematological: Negative.   Psychiatric/Behavioral: Negative.    Physical Exam Updated Vital Signs BP (!) 150/100   Pulse 68   Temp 99.2 F (37.3 C)   Resp 15   LMP 01/29/2020   SpO2 98%   Physical Exam Vitals reviewed.  Constitutional:      General: She is not in acute distress.    Appearance: She is obese. She is not ill-appearing, toxic-appearing or diaphoretic.  HENT:     Head: Normocephalic and atraumatic.  Eyes:     Extraocular Movements: Extraocular movements intact.     Right eye: Normal extraocular motion and no nystagmus.     Left eye: Normal extraocular motion and no nystagmus.     Pupils: Pupils are equal, round, and reactive to light.  Neck:     Comments: TTP L sternocleidomastoid Cardiovascular:     Rate and Rhythm: Normal rate and regular rhythm.     Heart sounds: No murmur. No friction rub. No  gallop.   Pulmonary:     Effort: Pulmonary effort is normal. No respiratory distress.     Breath sounds: Normal breath sounds. No wheezing or rales.  Abdominal:     General: Bowel sounds are normal. There is no distension.     Palpations: Abdomen is soft.     Tenderness: There is no abdominal tenderness. There is no guarding.  Musculoskeletal:        General: No swelling or tenderness.     Cervical back: Normal range of motion and neck supple.  Skin:    General: Skin is warm.  Neurological:     Mental Status: She is alert and oriented to person, place, and time.     GCS: GCS eye subscore is 4. GCS verbal subscore is 5. GCS motor subscore is 6.     Cranial Nerves: No cranial nerve deficit, dysarthria or facial asymmetry.     Sensory: No sensory deficit.     Motor: No weakness.  Psychiatric:        Mood and Affect: Mood normal.    ED Results / Procedures / Treatments   Labs (all labs ordered are listed, but only abnormal results are displayed) Labs Reviewed - No data to display  EKG None  Radiology No results found.  Procedures Procedures (including critical care time)  Medications Ordered in ED Medications - No data to display  ED Course  I have reviewed the triage vital signs and the nursing notes.  Pertinent labs & imaging results that were available during my care of the patient were reviewed by me and considered in my medical decision making (see chart for details).    MDM Rules/Calculators/A&P                      Patient's presentation of neck pain, and headache located in the back of her head appears to torticolis.  On exam, the patient has a tender left sternocleidomastoid muscle. Upon palpation, this exacerbates her symptoms.  Since her headache has been responsive to NSAIDs, will order Toradol and monitor patient's symptoms.  I encouraged the patient to follow-up with her PCP.  Patient states that her insurance has recently changed and she will pursue finding  a PCP.  Of note, the patient is also hypertensive (150/100), but is asymptomatic. I suspect this may be secondary to the pain she is currently experiencing.  She should also follow this up with  her PCP.      Final Clinical Impression(s) / ED Diagnoses Final diagnoses:  None   Rx / DC Orders ED Discharge Orders    None       Kirt Boys, MD 01/29/20 2671    Jacalyn Lefevre, MD 01/30/20 1542    Jacalyn Lefevre, MD 01/30/20 1553

## 2020-01-29 NOTE — ED Triage Notes (Signed)
Patient reports beck stiffness and headache x2 days. Hx migraines.

## 2021-08-03 DIAGNOSIS — G4700 Insomnia, unspecified: Secondary | ICD-10-CM | POA: Diagnosis not present

## 2021-08-03 DIAGNOSIS — F4321 Adjustment disorder with depressed mood: Secondary | ICD-10-CM | POA: Diagnosis not present

## 2021-09-09 DIAGNOSIS — G4700 Insomnia, unspecified: Secondary | ICD-10-CM | POA: Diagnosis not present

## 2021-09-09 DIAGNOSIS — F4321 Adjustment disorder with depressed mood: Secondary | ICD-10-CM | POA: Diagnosis not present

## 2021-09-30 DIAGNOSIS — Z23 Encounter for immunization: Secondary | ICD-10-CM | POA: Diagnosis not present

## 2021-09-30 DIAGNOSIS — D649 Anemia, unspecified: Secondary | ICD-10-CM | POA: Diagnosis not present

## 2021-09-30 DIAGNOSIS — Z0000 Encounter for general adult medical examination without abnormal findings: Secondary | ICD-10-CM | POA: Diagnosis not present

## 2021-09-30 DIAGNOSIS — R7989 Other specified abnormal findings of blood chemistry: Secondary | ICD-10-CM | POA: Diagnosis not present

## 2021-09-30 DIAGNOSIS — F4321 Adjustment disorder with depressed mood: Secondary | ICD-10-CM | POA: Diagnosis not present

## 2021-12-09 DIAGNOSIS — D509 Iron deficiency anemia, unspecified: Secondary | ICD-10-CM | POA: Diagnosis not present

## 2021-12-09 DIAGNOSIS — F4381 Prolonged grief disorder: Secondary | ICD-10-CM | POA: Diagnosis not present

## 2021-12-09 DIAGNOSIS — N840 Polyp of corpus uteri: Secondary | ICD-10-CM | POA: Diagnosis not present

## 2021-12-09 DIAGNOSIS — N920 Excessive and frequent menstruation with regular cycle: Secondary | ICD-10-CM | POA: Diagnosis not present

## 2021-12-09 DIAGNOSIS — E559 Vitamin D deficiency, unspecified: Secondary | ICD-10-CM | POA: Diagnosis not present

## 2021-12-16 ENCOUNTER — Other Ambulatory Visit: Payer: Self-pay | Admitting: *Deleted

## 2021-12-16 DIAGNOSIS — D509 Iron deficiency anemia, unspecified: Secondary | ICD-10-CM

## 2021-12-16 NOTE — Progress Notes (Signed)
New patient appt for 01/14/22 and labs two days before appt. Lab orders entered

## 2021-12-29 DIAGNOSIS — Z20822 Contact with and (suspected) exposure to covid-19: Secondary | ICD-10-CM | POA: Diagnosis not present

## 2022-01-11 ENCOUNTER — Inpatient Hospital Stay: Payer: Managed Care, Other (non HMO) | Attending: Oncology

## 2022-01-11 ENCOUNTER — Other Ambulatory Visit: Payer: Self-pay

## 2022-01-11 DIAGNOSIS — D509 Iron deficiency anemia, unspecified: Secondary | ICD-10-CM

## 2022-01-11 LAB — CBC WITH DIFFERENTIAL (CANCER CENTER ONLY)
Abs Immature Granulocytes: 0.01 10*3/uL (ref 0.00–0.07)
Basophils Absolute: 0 10*3/uL (ref 0.0–0.1)
Basophils Relative: 0 %
Eosinophils Absolute: 0.1 10*3/uL (ref 0.0–0.5)
Eosinophils Relative: 2 %
HCT: 31 % — ABNORMAL LOW (ref 36.0–46.0)
Hemoglobin: 9 g/dL — ABNORMAL LOW (ref 12.0–15.0)
Immature Granulocytes: 0 %
Lymphocytes Relative: 40 %
Lymphs Abs: 1.9 10*3/uL (ref 0.7–4.0)
MCH: 19.7 pg — ABNORMAL LOW (ref 26.0–34.0)
MCHC: 29 g/dL — ABNORMAL LOW (ref 30.0–36.0)
MCV: 67.7 fL — ABNORMAL LOW (ref 80.0–100.0)
Monocytes Absolute: 0.5 10*3/uL (ref 0.1–1.0)
Monocytes Relative: 11 %
Neutro Abs: 2.3 10*3/uL (ref 1.7–7.7)
Neutrophils Relative %: 47 %
Platelet Count: 450 10*3/uL — ABNORMAL HIGH (ref 150–400)
RBC: 4.58 MIL/uL (ref 3.87–5.11)
RDW: 19.1 % — ABNORMAL HIGH (ref 11.5–15.5)
WBC Count: 4.9 10*3/uL (ref 4.0–10.5)
nRBC: 0.4 % — ABNORMAL HIGH (ref 0.0–0.2)

## 2022-01-11 LAB — SAVE SMEAR(SSMR), FOR PROVIDER SLIDE REVIEW

## 2022-01-11 LAB — FERRITIN: Ferritin: 3 ng/mL — ABNORMAL LOW (ref 11–307)

## 2022-01-13 NOTE — Progress Notes (Deleted)
New Hematology/Oncology Consult ? ? ?Requesting MD: Dr. Pam Drown ? ?[phone] ? ?Reason for Consult: Iron deficiency anemia ? ?HPI: [name] is a 46 year old woman referred for evaluation of iron deficiency anemia in the setting of menorrhagia.  She was seen by Dr. Chipper Oman on 12/09/2021.  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  CBC showed hemoglobin 9.4, MCV 66, RDW 18, normal white count, platelet count elevated 491,000 (150,000-450,000). ? ? ? ?Past Medical History:  ?Diagnosis Date  ? Anxiety   ? Asthma   ? seasonal bronchitis  ? MVP (mitral valve prolapse)   ? Panic attacks   ?: ? ? ?Past Surgical History:  ?Procedure Laterality Date  ? CESAREAN SECTION    ? TUBAL LIGATION    ? ? ? ?Current Outpatient Medications:  ?  ibuprofen (ADVIL,MOTRIN) 800 MG tablet, Take 1 tablet (800 mg total) by mouth every 8 (eight) hours as needed., Disp: 21 tablet, Rfl: 0 ?  methocarbamol (ROBAXIN) 500 MG tablet, Take 1 tablet (500 mg total) by mouth every 8 (eight) hours as needed., Disp: 20 tablet, Rfl: 0: ? ? ? ?Allergies  ?Allergen Reactions  ? Ciprofloxacin Itching  ? Codeine Itching  ?  Ok with benadryl  ? ?FH: ? ?SOCIAL HISTORY: ? ?Review of Systems: ? ?Physical Exam: ? ?There were no vitals taken for this visit. ? ?HEENT: *** ?Lungs: *** ?Cardiac: *** ?Abdomen: *** ?GU: ***  ?Vascular: *** ?Lymph nodes: *** ?Neurologic: *** ?Skin: *** ?Musculoskeletal: *** ? ?LABS: ? ? ?Recent Labs  ?  01/11/22 ?0959  ?WBC 4.9  ?HGB 9.0*  ?HCT 31.0*  ?PLT 450*  ? ?Ferritin 3 ? ? ? ?RADIOLOGY: ? ?No results found. ? ?Assessment and Plan:  ? ?*** ? ? ? ?Ned Card, NP ?01/13/2022, 3:41 PM  ?

## 2022-01-14 ENCOUNTER — Inpatient Hospital Stay: Payer: Managed Care, Other (non HMO) | Attending: Nurse Practitioner | Admitting: Nurse Practitioner

## 2022-01-15 ENCOUNTER — Telehealth: Payer: Self-pay | Admitting: Nurse Practitioner

## 2022-01-15 NOTE — Telephone Encounter (Signed)
Contacted patient to reschedule initial visit from referral. Patient stayed that she was not aware of the visit that first was scheduled on 3/2 for her. She apologized and rescheduled for 4/6. ?

## 2022-01-20 DIAGNOSIS — F4381 Prolonged grief disorder: Secondary | ICD-10-CM | POA: Diagnosis not present

## 2022-02-16 ENCOUNTER — Inpatient Hospital Stay: Payer: Managed Care, Other (non HMO)

## 2022-02-18 ENCOUNTER — Encounter: Payer: Self-pay | Admitting: Nurse Practitioner

## 2022-02-18 ENCOUNTER — Inpatient Hospital Stay: Payer: Managed Care, Other (non HMO)

## 2022-02-18 ENCOUNTER — Inpatient Hospital Stay: Payer: Managed Care, Other (non HMO) | Attending: Nurse Practitioner | Admitting: Nurse Practitioner

## 2022-02-18 VITALS — BP 150/97 | HR 98 | Temp 97.8°F | Resp 18 | Ht 62.0 in | Wt 252.0 lb

## 2022-02-18 DIAGNOSIS — D573 Sickle-cell trait: Secondary | ICD-10-CM | POA: Diagnosis not present

## 2022-02-18 DIAGNOSIS — D509 Iron deficiency anemia, unspecified: Secondary | ICD-10-CM | POA: Diagnosis not present

## 2022-02-18 DIAGNOSIS — J45909 Unspecified asthma, uncomplicated: Secondary | ICD-10-CM | POA: Diagnosis not present

## 2022-02-18 DIAGNOSIS — I341 Nonrheumatic mitral (valve) prolapse: Secondary | ICD-10-CM | POA: Diagnosis not present

## 2022-02-18 DIAGNOSIS — Z800 Family history of malignant neoplasm of digestive organs: Secondary | ICD-10-CM | POA: Diagnosis not present

## 2022-02-18 LAB — CBC WITH DIFFERENTIAL (CANCER CENTER ONLY)
Abs Immature Granulocytes: 0.02 10*3/uL (ref 0.00–0.07)
Basophils Absolute: 0 10*3/uL (ref 0.0–0.1)
Basophils Relative: 1 %
Eosinophils Absolute: 0.1 10*3/uL (ref 0.0–0.5)
Eosinophils Relative: 1 %
HCT: 32.3 % — ABNORMAL LOW (ref 36.0–46.0)
Hemoglobin: 9.5 g/dL — ABNORMAL LOW (ref 12.0–15.0)
Immature Granulocytes: 0 %
Lymphocytes Relative: 46 %
Lymphs Abs: 2.5 10*3/uL (ref 0.7–4.0)
MCH: 20.2 pg — ABNORMAL LOW (ref 26.0–34.0)
MCHC: 29.4 g/dL — ABNORMAL LOW (ref 30.0–36.0)
MCV: 68.6 fL — ABNORMAL LOW (ref 80.0–100.0)
Monocytes Absolute: 0.6 10*3/uL (ref 0.1–1.0)
Monocytes Relative: 10 %
Neutro Abs: 2.3 10*3/uL (ref 1.7–7.7)
Neutrophils Relative %: 42 %
Platelet Count: 435 10*3/uL — ABNORMAL HIGH (ref 150–400)
RBC: 4.71 MIL/uL (ref 3.87–5.11)
RDW: 21.3 % — ABNORMAL HIGH (ref 11.5–15.5)
WBC Count: 5.4 10*3/uL (ref 4.0–10.5)
nRBC: 0 % (ref 0.0–0.2)

## 2022-02-18 LAB — URINALYSIS, COMPLETE (UACMP) WITH MICROSCOPIC
Bilirubin Urine: NEGATIVE
Glucose, UA: NEGATIVE mg/dL
Hgb urine dipstick: NEGATIVE
Ketones, ur: NEGATIVE mg/dL
Leukocytes,Ua: NEGATIVE
Nitrite: NEGATIVE
Protein, ur: NEGATIVE mg/dL
Specific Gravity, Urine: 1.01 (ref 1.005–1.030)
pH: 7 (ref 5.0–8.0)

## 2022-02-18 LAB — SAVE SMEAR(SSMR), FOR PROVIDER SLIDE REVIEW

## 2022-02-18 LAB — FERRITIN: Ferritin: 4 ng/mL — ABNORMAL LOW (ref 11–307)

## 2022-02-18 NOTE — Progress Notes (Signed)
New Hematology/Oncology Consult ? ? ?Requesting MD: Dr. Maxie Better ? ?[phone] ? ?Reason for Consult: Iron deficiency anemia ? ?HPI: Christina Armstrong is a 46 year old woman referred for evaluation of iron deficiency anemia.  Per Dr. Cherly Hensen office note dated 12/09/2021 Christina Armstrong has menorrhagia due to endometrial polyps with recommendations for diagnostic hysteroscopy, D&C, resection of endometrial polyps.  CBC from 12/09/2021 returned with a hemoglobin of 9.4, MCV 66, RDW 18, white count 7.5, platelet count 491,000. ? ?We did labs in our office on 01/11/2022 at which time the hemoglobin returned at 9.0, MCV 67, RDW 19, platelet count 450,000, ferritin 3.  She was initially scheduled to be seen 01/14/2022.  She was not aware she had an appointment that day.  She was rescheduled to today. ? ?Review of labs in the EMR show a microcytic anemia dating to 12/29/2013 at which time the hemoglobin was 11, MCV 75.  Most remote hemoglobin 12/06/2008 hemoglobin 13/hematocrit 41; 01/01/2009 hemoglobin 12.6, MCV 86.6. ? ? ? ?Past Medical History:  ?Diagnosis Date  ? Anxiety   ? Asthma   ? seasonal bronchitis  ? MVP (mitral valve prolapse)   ? Panic attacks   ?Sickle cell trait ? ? ?Past Surgical History:  ?Procedure Laterality Date  ? CESAREAN SECTION    ? TUBAL LIGATION    ? ? ? ?Current Outpatient Medications:  ?  Liraglutide -Weight Management (SAXENDA) 18 MG/3ML SOPN, 0.6 mg, Disp: , Rfl:  ?  traZODone (DESYREL) 50 MG tablet, 1-2 tablets at bedtime as needed, Disp: , Rfl:  ?  ibuprofen (ADVIL,MOTRIN) 800 MG tablet, Take 1 tablet (800 mg total) by mouth every 8 (eight) hours as needed. (Patient not taking: Reported on 02/18/2022), Disp: 21 tablet, Rfl: 0 ?  methocarbamol (ROBAXIN) 500 MG tablet, Take 1 tablet (500 mg total) by mouth every 8 (eight) hours as needed. (Patient not taking: Reported on 02/18/2022), Disp: 20 tablet, Rfl: 0: ? ? ? ?Allergies  ?Allergen Reactions  ? Ciprofloxacin Itching  ? Codeine Itching  ?  Ok with  benadryl  ? ? ?FH: Mother with anemia, maternal grandmother with anemia, colon cancer ? ?SOCIAL HISTORY: She lives in [place].  She works as an Advertising account planner.  No tobacco use.  Occasional alcohol intake. ? ?Review of Systems: She reports a menstrual cycle every 28 days, lasting 8 days.  Bleeding is heavy for 4-5 of the days.  She frequently notes blood clots.  She has significant cramping with the menstrual cycle.  She denies other bleeding.  Specifically no hematuria.  No bloody or black bowel movements.  She craves ice.  She eats a regular diet but intake of red meat is rare.  She notes some cracking at the corners of the lips.  Nails are brittle.  She notes hair shedding.  No fevers or sweats.  Energy level is low.  She is intermittently short of breath.  No urinary symptoms.  She reports gaining weight during COVID. ? ?She has tried to take oral iron in the past.  She has difficulty swallowing the pills and also notes constipation.  She has not tried a stool softener. ? ?Physical Exam: ? ?Blood pressure (!) 150/97, pulse 98, temperature 97.8 ?F (36.6 ?C), temperature source Oral, resp. rate 18, height 5\' 2"  (1.575 m), weight 252 lb (114.3 kg), SpO2 99 %. ? ?HEENT: No thrush or ulcers. ?Lungs: Lungs clear bilaterally. ?Cardiac: Regular rate and rhythm. ?Abdomen: Abdomen soft and nontender.  No hepatosplenomegaly. ?Vascular: No leg edema. ?Lymph nodes:  No palpable cervical, supraocular, axillary or inguinal lymph nodes. ?Neurologic: Alert and oriented.  Follows commands.  Gait normal. ?Skin: No rash.  Raised discolored lesion at the right upper arm.  Small dark irregular mole right upper abdomen. ? ?LABS: ? ? ?Recent Labs  ?  02/18/22 ?1605  ?WBC 5.4  ?HGB 9.5*  ?HCT 32.3*  ?PLT 435*  ?Peripheral blood smear-ovalocytes, numerous targets, few teardrops, variation in red cell size, no nucleated red blood cells; white blood cell morphology unremarkable; platelets appear mildly increased. ? ?No results for  input(s): NA, K, CL, CO2, GLUCOSE, BUN, CREATININE, CALCIUM in the last 72 hours. ? ? ? ?RADIOLOGY: ? ?No results found. ? ?Assessment and Plan:  ? ?Iron deficiency anemia likely due to menorrhagia ?Asthma ?Mitral valve prolapse ?Sickle cell trait ? ?Christina Armstrong was referred for evaluation of iron deficiency anemia.  This is likely due to menorrhagia.  We will obtain a urinalysis today.  She will complete a set of stool cards.  She agrees to a trial of oral iron.  She will begin liquid ferrous sulfate 325 mg twice daily.  She will take a stool softener or laxative as needed.  She will contact the office if she develops significant constipation and we will consider IV iron.  She will follow-up with Dr. Cherly Hensen regarding the menorrhagia. ? ?She will contact her dermatologist for evaluation of the skin lesions at the right upper arm and right abdomen. ? ?She will return for lab and follow-up in 4 weeks.  We are available to see her sooner if needed. ? ?Patient seen with Dr. Truett Perna. ? ? ? ?Lonna Cobb, NP ?02/18/2022, 5:01 PM  ? ?This was a shared visit with Lonna Cobb.  Christina Armstrong was interviewed and examined.  I reviewed the peripheral blood smear.  She was referred for evaluation of anemia.  She has iron deficiency anemia, likely secondary to menstrual blood loss.  She will begin a trial of oral iron replacement. ? ?She reports a history of sickle cell trait.  This is most likely not contributing to the anemia.  There is significant targeting on the peripheral blood smear which can be seen in thalassemia variants.  Remote CBCs reveal a normal MCV arguing against thalassemia trait. ? ?I was present for greater than 50% of today's visit.  I performed medical decision making. ? ?Mancel Bale, MD ?

## 2022-03-16 ENCOUNTER — Inpatient Hospital Stay: Payer: Managed Care, Other (non HMO) | Attending: Nurse Practitioner

## 2022-03-16 ENCOUNTER — Inpatient Hospital Stay: Payer: Managed Care, Other (non HMO) | Admitting: Oncology

## 2022-03-16 DIAGNOSIS — R519 Headache, unspecified: Secondary | ICD-10-CM | POA: Diagnosis not present

## 2022-03-16 DIAGNOSIS — D573 Sickle-cell trait: Secondary | ICD-10-CM | POA: Diagnosis not present

## 2022-03-16 DIAGNOSIS — D649 Anemia, unspecified: Secondary | ICD-10-CM | POA: Diagnosis not present

## 2022-03-16 DIAGNOSIS — J45909 Unspecified asthma, uncomplicated: Secondary | ICD-10-CM | POA: Diagnosis not present

## 2022-03-16 DIAGNOSIS — N920 Excessive and frequent menstruation with regular cycle: Secondary | ICD-10-CM | POA: Insufficient documentation

## 2022-03-16 DIAGNOSIS — I341 Nonrheumatic mitral (valve) prolapse: Secondary | ICD-10-CM | POA: Diagnosis not present

## 2022-03-16 DIAGNOSIS — G4700 Insomnia, unspecified: Secondary | ICD-10-CM | POA: Diagnosis not present

## 2022-03-16 DIAGNOSIS — D509 Iron deficiency anemia, unspecified: Secondary | ICD-10-CM | POA: Diagnosis not present

## 2022-03-16 LAB — CBC WITH DIFFERENTIAL (CANCER CENTER ONLY)
Abs Immature Granulocytes: 0.01 10*3/uL (ref 0.00–0.07)
Basophils Absolute: 0 10*3/uL (ref 0.0–0.1)
Basophils Relative: 0 %
Eosinophils Absolute: 0.1 10*3/uL (ref 0.0–0.5)
Eosinophils Relative: 1 %
HCT: 32.5 % — ABNORMAL LOW (ref 36.0–46.0)
Hemoglobin: 9.6 g/dL — ABNORMAL LOW (ref 12.0–15.0)
Immature Granulocytes: 0 %
Lymphocytes Relative: 46 %
Lymphs Abs: 2.3 10*3/uL (ref 0.7–4.0)
MCH: 20.7 pg — ABNORMAL LOW (ref 26.0–34.0)
MCHC: 29.5 g/dL — ABNORMAL LOW (ref 30.0–36.0)
MCV: 70.2 fL — ABNORMAL LOW (ref 80.0–100.0)
Monocytes Absolute: 0.5 10*3/uL (ref 0.1–1.0)
Monocytes Relative: 9 %
Neutro Abs: 2.3 10*3/uL (ref 1.7–7.7)
Neutrophils Relative %: 44 %
Platelet Count: 434 10*3/uL — ABNORMAL HIGH (ref 150–400)
RBC: 4.63 MIL/uL (ref 3.87–5.11)
RDW: 22.5 % — ABNORMAL HIGH (ref 11.5–15.5)
WBC Count: 5.1 10*3/uL (ref 4.0–10.5)
nRBC: 0 % (ref 0.0–0.2)

## 2022-03-16 LAB — FERRITIN: Ferritin: 9 ng/mL — ABNORMAL LOW (ref 11–307)

## 2022-03-17 ENCOUNTER — Telehealth: Payer: Self-pay

## 2022-03-17 NOTE — Telephone Encounter (Signed)
Called the patient to let her know Hb is stable. The patient is taking oral iron and she is not having any problem with tolerating oral iron. ?

## 2022-03-17 NOTE — Telephone Encounter (Signed)
-----   Message from Rana Snare, NP sent at 03/17/2022  7:44 AM EDT ----- ?Please let her know Hb is stable. Is she taking oral iron? Is she having any problems tolerating oral iron? ? ?

## 2022-03-22 ENCOUNTER — Other Ambulatory Visit: Payer: Self-pay | Admitting: Nurse Practitioner

## 2022-03-22 DIAGNOSIS — D509 Iron deficiency anemia, unspecified: Secondary | ICD-10-CM

## 2022-03-26 ENCOUNTER — Inpatient Hospital Stay (HOSPITAL_BASED_OUTPATIENT_CLINIC_OR_DEPARTMENT_OTHER): Payer: Managed Care, Other (non HMO) | Admitting: Oncology

## 2022-03-26 ENCOUNTER — Encounter: Payer: Self-pay | Admitting: Oncology

## 2022-03-26 VITALS — BP 139/95 | HR 89 | Temp 98.2°F | Resp 18 | Ht 62.0 in | Wt 250.4 lb

## 2022-03-26 DIAGNOSIS — D509 Iron deficiency anemia, unspecified: Secondary | ICD-10-CM

## 2022-03-26 DIAGNOSIS — J45909 Unspecified asthma, uncomplicated: Secondary | ICD-10-CM | POA: Diagnosis not present

## 2022-03-26 DIAGNOSIS — N920 Excessive and frequent menstruation with regular cycle: Secondary | ICD-10-CM | POA: Diagnosis not present

## 2022-03-26 DIAGNOSIS — I341 Nonrheumatic mitral (valve) prolapse: Secondary | ICD-10-CM | POA: Diagnosis not present

## 2022-03-26 DIAGNOSIS — D573 Sickle-cell trait: Secondary | ICD-10-CM | POA: Diagnosis not present

## 2022-03-26 DIAGNOSIS — R519 Headache, unspecified: Secondary | ICD-10-CM | POA: Diagnosis not present

## 2022-03-26 NOTE — Progress Notes (Signed)
?  [facility] ?OFFICE PROGRESS NOTE ? ? ?Diagnosis: Iron deficiency anemia ? ?INTERVAL HISTORY:  ? ?Ms. Hefty continues to have heavy menses.  No other bleeding.  She did not tolerate the liquid iron.  She is now taking an over-the-counter iron/vitamin C preparation, 65 mg of elemental iron.  She is not taking the iron consistently. ?She reports left-sided headaches for years.  No associated symptoms.  She saw her primary provider to evaluate the skin lesions and reports these were felt to be benign. ? ?Objective: ? ?Vital signs in last 24 hours: ? ?Blood pressure (!) 139/95, pulse 89, temperature 98.2 ?F (36.8 ?C), temperature source Oral, resp. rate 18, height 5\' 2"  (1.575 m), weight 250 lb 6.4 oz (113.6 kg), SpO2 98 %. ?  ? ?Resp: Lungs clear bilaterally ?Cardio: Regular rate and rhythm ?GI: Nontender, no hepatosplenomegaly ?Vascular: No leg edema ? ? ?Lab Results: ? ?Lab Results  ?Component Value Date  ? WBC 5.1 03/16/2022  ? HGB 9.6 (L) 03/16/2022  ? HCT 32.5 (L) 03/16/2022  ? MCV 70.2 (L) 03/16/2022  ? PLT 434 (H) 03/16/2022  ? NEUTROABS 2.3 03/16/2022  ? ? ?CMP  ?Lab Results  ?Component Value Date  ? NA 137 01/11/2018  ? K 3.1 (L) 01/11/2018  ? CL 105 01/11/2018  ? CO2 26 01/11/2018  ? GLUCOSE 86 01/11/2018  ? BUN 9 01/11/2018  ? CREATININE 0.56 01/11/2018  ? CALCIUM 8.6 (L) 01/11/2018  ? PROT 7.2 01/11/2018  ? ALBUMIN 3.6 01/11/2018  ? AST 16 01/11/2018  ? ALT 15 01/11/2018  ? ALKPHOS 64 01/11/2018  ? BILITOT 0.1 (L) 01/11/2018  ? GFRNONAA >60 01/11/2018  ? GFRAA >60 01/11/2018  ?Ferritin on 03/17/2019 23-9 ? ? ?Medications: I have reviewed the patient's current medications. ? ? ?Assessment/Plan: ?Iron deficiency anemia likely due to menorrhagia ?Asthma ?Mitral valve prolapse ?Sickle cell trait ?Headaches ? ? ? ?Disposition: ?Ms. Keay has persistent iron deficiency anemia.  She is not taking iron consistently.  I recommended she take iron twice daily.  She will return for an office and lab visit  in 1 month.  We will consider a trial of IV iron if the hemoglobin has not improved when she is here next month.  She will return stool Hemoccult cards. ?The headaches could be in part related to anemia.  I recommended she follow-up with her primary provider for headache evaluation/management. ? ? ? ?05/17/2019, MD ? ?03/26/2022  ?12:48 PM ? ? ?

## 2022-04-27 ENCOUNTER — Inpatient Hospital Stay: Payer: Managed Care, Other (non HMO)

## 2022-04-27 ENCOUNTER — Inpatient Hospital Stay: Payer: Managed Care, Other (non HMO) | Admitting: Nurse Practitioner

## 2022-05-05 ENCOUNTER — Inpatient Hospital Stay: Payer: Managed Care, Other (non HMO) | Admitting: Nurse Practitioner

## 2022-05-05 ENCOUNTER — Inpatient Hospital Stay: Payer: Managed Care, Other (non HMO) | Attending: Nurse Practitioner

## 2022-05-05 DIAGNOSIS — J45909 Unspecified asthma, uncomplicated: Secondary | ICD-10-CM | POA: Insufficient documentation

## 2022-05-05 DIAGNOSIS — D509 Iron deficiency anemia, unspecified: Secondary | ICD-10-CM | POA: Insufficient documentation

## 2022-05-05 DIAGNOSIS — R519 Headache, unspecified: Secondary | ICD-10-CM | POA: Insufficient documentation

## 2022-05-05 DIAGNOSIS — I341 Nonrheumatic mitral (valve) prolapse: Secondary | ICD-10-CM | POA: Insufficient documentation

## 2022-05-05 DIAGNOSIS — D573 Sickle-cell trait: Secondary | ICD-10-CM | POA: Insufficient documentation

## 2022-05-06 ENCOUNTER — Telehealth: Payer: Self-pay | Admitting: *Deleted

## 2022-05-06 NOTE — Telephone Encounter (Signed)
Call to [name] to reschedule her missed 1 month f/u. Rescheduled to 6/30 at 1315/1345. Report having URI virus recently and still has cough. Home tested and COVID was negative.

## 2022-05-14 ENCOUNTER — Inpatient Hospital Stay (HOSPITAL_BASED_OUTPATIENT_CLINIC_OR_DEPARTMENT_OTHER): Payer: Managed Care, Other (non HMO) | Admitting: Nurse Practitioner

## 2022-05-14 ENCOUNTER — Inpatient Hospital Stay: Payer: Managed Care, Other (non HMO)

## 2022-05-14 ENCOUNTER — Encounter: Payer: Self-pay | Admitting: Nurse Practitioner

## 2022-05-14 VITALS — BP 131/91 | HR 88 | Temp 98.1°F | Resp 18 | Ht 62.0 in | Wt 246.8 lb

## 2022-05-14 DIAGNOSIS — J45909 Unspecified asthma, uncomplicated: Secondary | ICD-10-CM | POA: Diagnosis not present

## 2022-05-14 DIAGNOSIS — D509 Iron deficiency anemia, unspecified: Secondary | ICD-10-CM

## 2022-05-14 DIAGNOSIS — D573 Sickle-cell trait: Secondary | ICD-10-CM | POA: Diagnosis not present

## 2022-05-14 DIAGNOSIS — I341 Nonrheumatic mitral (valve) prolapse: Secondary | ICD-10-CM | POA: Diagnosis not present

## 2022-05-14 DIAGNOSIS — R519 Headache, unspecified: Secondary | ICD-10-CM | POA: Diagnosis not present

## 2022-05-14 LAB — CBC WITH DIFFERENTIAL (CANCER CENTER ONLY)
Abs Immature Granulocytes: 0.01 10*3/uL (ref 0.00–0.07)
Basophils Absolute: 0 10*3/uL (ref 0.0–0.1)
Basophils Relative: 0 %
Eosinophils Absolute: 0.1 10*3/uL (ref 0.0–0.5)
Eosinophils Relative: 1 %
HCT: 35.8 % — ABNORMAL LOW (ref 36.0–46.0)
Hemoglobin: 11.2 g/dL — ABNORMAL LOW (ref 12.0–15.0)
Immature Granulocytes: 0 %
Lymphocytes Relative: 47 %
Lymphs Abs: 2.4 10*3/uL (ref 0.7–4.0)
MCH: 23.8 pg — ABNORMAL LOW (ref 26.0–34.0)
MCHC: 31.3 g/dL (ref 30.0–36.0)
MCV: 76 fL — ABNORMAL LOW (ref 80.0–100.0)
Monocytes Absolute: 0.5 10*3/uL (ref 0.1–1.0)
Monocytes Relative: 10 %
Neutro Abs: 2.2 10*3/uL (ref 1.7–7.7)
Neutrophils Relative %: 42 %
Platelet Count: 461 10*3/uL — ABNORMAL HIGH (ref 150–400)
RBC: 4.71 MIL/uL (ref 3.87–5.11)
RDW: 20.1 % — ABNORMAL HIGH (ref 11.5–15.5)
WBC Count: 5.2 10*3/uL (ref 4.0–10.5)
nRBC: 0 % (ref 0.0–0.2)

## 2022-05-14 LAB — FERRITIN: Ferritin: 12 ng/mL (ref 11–307)

## 2022-05-14 NOTE — Progress Notes (Signed)
  [facility] OFFICE PROGRESS NOTE   Diagnosis: Iron deficiency anemia  INTERVAL HISTORY:   Christina Armstrong returns as scheduled.  She continues oral iron.  Energy level is better.  Only bleeding is related to her menstrual cycle.  She had a recent upper respiratory infection.  Symptoms are improving.  Objective:  Vital signs in last 24 hours:  Blood pressure (!) 131/91, pulse 88, temperature 98.1 F (36.7 C), temperature source Oral, resp. rate 18, height 5\' 2"  (1.575 m), weight 246 lb 12.8 oz (111.9 kg), SpO2 98 %.     Resp: Lungs clear bilaterally. Cardio: Regular rate and rhythm. GI: Abdomen soft and nontender.  No hepatosplenomegaly. Vascular: No leg edema.   Lab Results:  Lab Results  Component Value Date   WBC 5.2 05/14/2022   HGB 11.2 (L) 05/14/2022   HCT 35.8 (L) 05/14/2022   MCV 76.0 (L) 05/14/2022   PLT 461 (H) 05/14/2022   NEUTROABS 2.2 05/14/2022    Imaging:  No results found.  Medications: I have reviewed the patient's current medications.  Assessment/Plan: Iron deficiency anemia likely due to menorrhagia Asthma Mitral valve prolapse Sickle cell trait Headaches  Disposition: Christina Armstrong appears stable.  Hemoglobin is better.  She will continue oral iron.  We will follow-up on the ferritin level from today.  She plans to contact her gynecologist to discuss management of the menstrual cycle.  She will return for lab and follow-up in 4 to 6 weeks.  We are available to see her sooner if needed.    Jacqulyn Bath ANP/GNP-BC   05/14/2022  2:12 PM

## 2022-06-17 ENCOUNTER — Inpatient Hospital Stay: Payer: Managed Care, Other (non HMO) | Attending: Nurse Practitioner

## 2022-06-17 ENCOUNTER — Telehealth: Payer: Self-pay

## 2022-06-17 ENCOUNTER — Inpatient Hospital Stay: Payer: Managed Care, Other (non HMO) | Admitting: Oncology

## 2022-06-17 NOTE — Telephone Encounter (Signed)
V/M message left for pt inquiring about missed appointment.

## 2022-07-16 ENCOUNTER — Inpatient Hospital Stay (HOSPITAL_BASED_OUTPATIENT_CLINIC_OR_DEPARTMENT_OTHER): Payer: Managed Care, Other (non HMO) | Admitting: Oncology

## 2022-07-16 ENCOUNTER — Inpatient Hospital Stay: Payer: Managed Care, Other (non HMO) | Attending: Nurse Practitioner

## 2022-07-16 VITALS — BP 135/89 | HR 86 | Temp 98.1°F | Resp 20 | Ht 62.0 in | Wt 251.8 lb

## 2022-07-16 DIAGNOSIS — D573 Sickle-cell trait: Secondary | ICD-10-CM

## 2022-07-16 DIAGNOSIS — I341 Nonrheumatic mitral (valve) prolapse: Secondary | ICD-10-CM

## 2022-07-16 DIAGNOSIS — R519 Headache, unspecified: Secondary | ICD-10-CM | POA: Diagnosis not present

## 2022-07-16 DIAGNOSIS — N920 Excessive and frequent menstruation with regular cycle: Secondary | ICD-10-CM | POA: Insufficient documentation

## 2022-07-16 DIAGNOSIS — J45909 Unspecified asthma, uncomplicated: Secondary | ICD-10-CM | POA: Insufficient documentation

## 2022-07-16 DIAGNOSIS — D509 Iron deficiency anemia, unspecified: Secondary | ICD-10-CM | POA: Diagnosis not present

## 2022-07-16 LAB — CBC WITH DIFFERENTIAL (CANCER CENTER ONLY)
Abs Immature Granulocytes: 0.01 10*3/uL (ref 0.00–0.07)
Basophils Absolute: 0 10*3/uL (ref 0.0–0.1)
Basophils Relative: 0 %
Eosinophils Absolute: 0.1 10*3/uL (ref 0.0–0.5)
Eosinophils Relative: 1 %
HCT: 36.1 % (ref 36.0–46.0)
Hemoglobin: 11.7 g/dL — ABNORMAL LOW (ref 12.0–15.0)
Immature Granulocytes: 0 %
Lymphocytes Relative: 34 %
Lymphs Abs: 1.8 10*3/uL (ref 0.7–4.0)
MCH: 26.5 pg (ref 26.0–34.0)
MCHC: 32.4 g/dL (ref 30.0–36.0)
MCV: 81.7 fL (ref 80.0–100.0)
Monocytes Absolute: 0.4 10*3/uL (ref 0.1–1.0)
Monocytes Relative: 8 %
Neutro Abs: 2.9 10*3/uL (ref 1.7–7.7)
Neutrophils Relative %: 57 %
Platelet Count: 336 10*3/uL (ref 150–400)
RBC: 4.42 MIL/uL (ref 3.87–5.11)
RDW: 17.4 % — ABNORMAL HIGH (ref 11.5–15.5)
WBC Count: 5.3 10*3/uL (ref 4.0–10.5)
nRBC: 0 % (ref 0.0–0.2)

## 2022-07-16 LAB — FERRITIN: Ferritin: 19 ng/mL (ref 11–307)

## 2022-07-16 NOTE — Progress Notes (Signed)
  [facility] OFFICE PROGRESS NOTE   Diagnosis: Iron deficiency anemia  INTERVAL HISTORY:   [name] returns as scheduled.  She continues to have heavy menses.  No other bleeding.  She is taking an over-the-counter iron preparation.  The current iron also has vitamin C.  She does not have constipation with this brand.  She had an episode of palpitations and chest discomfort 1 week ago.  This occurred while at home.  She was seen by the paramedics and did not go to the hospital.  No other complaint.  Objective:  Vital signs in last 24 hours:  Blood pressure 135/89, pulse 86, temperature 98.1 F (36.7 C), temperature source Oral, resp. rate 20, height 5\' 2"  (1.575 m), weight 251 lb 12.8 oz (114.2 kg), SpO2 100 %.   Resp: Lungs clear bilaterally Cardio: Regular rate and rhythm, rare premature beat GI: No hepatosplenomegaly, nontender Vascular: No leg edema  Lab Results:  Lab Results  Component Value Date   WBC 5.3 07/16/2022   HGB 11.7 (L) 07/16/2022   HCT 36.1 07/16/2022   MCV 81.7 07/16/2022   PLT 336 07/16/2022   NEUTROABS 2.9 07/16/2022    CMP  Lab Results  Component Value Date   NA 137 01/11/2018   K 3.1 (L) 01/11/2018   CL 105 01/11/2018   CO2 26 01/11/2018   GLUCOSE 86 01/11/2018   BUN 9 01/11/2018   CREATININE 0.56 01/11/2018   CALCIUM 8.6 (L) 01/11/2018   PROT 7.2 01/11/2018   ALBUMIN 3.6 01/11/2018   AST 16 01/11/2018   ALT 15 01/11/2018   ALKPHOS 64 01/11/2018   BILITOT 0.1 (L) 01/11/2018   GFRNONAA >60 01/11/2018   GFRAA >60 01/11/2018     Medications: I have reviewed the patient's current medications.   Assessment/Plan: Iron deficiency anemia likely due to menorrhagia Asthma Mitral valve prolapse Sickle cell trait Headaches    Disposition: Christina Armstrong has iron deficiency anemia, likely secondary to menorrhagia.  The anemia has improved with oral iron therapy.  The ferritin is in the low normal range.  She will continue iron  daily.  She will return for an office visit and CBC in 3 months.  She is following up with GYN to consider options for managing the menorrhagia.  Jacqulyn Bath, MD  07/16/2022  1:59 PM

## 2022-09-07 ENCOUNTER — Encounter: Payer: Self-pay | Admitting: *Deleted

## 2022-09-07 NOTE — Progress Notes (Signed)
Received faxed request from [facility] requesting office notes and labs be faxed to [phone].  Signed ROI from patient sent to be scanned.

## 2022-10-29 ENCOUNTER — Inpatient Hospital Stay: Payer: Managed Care, Other (non HMO) | Attending: Nurse Practitioner

## 2022-10-29 ENCOUNTER — Inpatient Hospital Stay: Payer: Managed Care, Other (non HMO) | Admitting: Oncology

## 2023-02-20 ENCOUNTER — Other Ambulatory Visit: Payer: Self-pay | Admitting: Obstetrics and Gynecology

## 2023-03-03 ENCOUNTER — Encounter (HOSPITAL_BASED_OUTPATIENT_CLINIC_OR_DEPARTMENT_OTHER): Payer: Self-pay | Admitting: Obstetrics and Gynecology

## 2023-03-03 NOTE — Progress Notes (Addendum)
Spoke w/ via phone for pre-op interview--- pt Lab needs dos---- cbc, urine preg              Lab results------ no COVID test -----patient states asymptomatic no test needed Arrive at ------- 0800 on 03-18-2023 NPO after MN NO Solid Food.  Clear liquids from MN until--- 0700 Med rec completed Medications to take morning of surgery ----- none Diabetic medication ----- n/a Patient instructed no nail polish to be worn day of surgery Patient instructed to bring photo id and insurance card day of surgery Patient aware to have Driver (ride ) / caregiver    for 24 hours after surgery -- aunt, terri Patient Special Instructions ----- per pt dose do wegovy has not be able to fill since get since last year due to back order Pre-Op special Instructions ----- n/a Patient verbalized understanding of instructions that were given at this phone interview. Patient denies shortness of breath, chest pain, fever, cough at this phone interview.

## 2023-03-17 ENCOUNTER — Encounter (HOSPITAL_BASED_OUTPATIENT_CLINIC_OR_DEPARTMENT_OTHER): Payer: Self-pay | Admitting: Obstetrics and Gynecology

## 2023-03-17 NOTE — Progress Notes (Signed)
Spoke w/ via phone for pre-op interview--- pt Lab needs dos---- cbc, urine preg              Lab results------ no COVID test -----patient states asymptomatic no test needed Arrive at ------- 11300 on 03-25-2023 NPO after MN NO Solid Food.  Clear liquids from MN until--- 1030 Med rec completed Medications to take morning of surgery ----- none Diabetic medication ----- n/a Patient instructed no nail polish to be worn day of surgery Patient instructed to bring photo id and insurance card day of surgery Patient aware to have Driver (ride ) / caregiver    for 24 hours after surgery -- aunt, terri Patient Special Instructions ----- per pt dose do wegovy has not be able to fill since get since last year due to back order Pre-Op special Instructions ----- n/a Patient verbalized understanding of instructions that were given at this phone interview. Patient denies shortness of breath, chest pain, fever, cough at this phone interview.

## 2023-03-18 DIAGNOSIS — Z01818 Encounter for other preprocedural examination: Secondary | ICD-10-CM

## 2023-03-25 ENCOUNTER — Other Ambulatory Visit: Payer: Self-pay

## 2023-03-25 ENCOUNTER — Encounter (HOSPITAL_BASED_OUTPATIENT_CLINIC_OR_DEPARTMENT_OTHER): Payer: Self-pay | Admitting: Obstetrics and Gynecology

## 2023-03-25 ENCOUNTER — Encounter (HOSPITAL_BASED_OUTPATIENT_CLINIC_OR_DEPARTMENT_OTHER)
Admission: RE | Disposition: A | Discharge status: Home / Self Care | Payer: Self-pay | Source: Home / Self Care | Attending: Obstetrics and Gynecology

## 2023-03-25 ENCOUNTER — Ambulatory Visit (HOSPITAL_BASED_OUTPATIENT_CLINIC_OR_DEPARTMENT_OTHER): Payer: 59 | Admitting: Anesthesiology

## 2023-03-25 ENCOUNTER — Ambulatory Visit (HOSPITAL_BASED_OUTPATIENT_CLINIC_OR_DEPARTMENT_OTHER)
Admission: RE | Admit: 2023-03-25 | Discharge: 2023-03-25 | Disposition: A | Discharge status: Home / Self Care | Payer: Medicaid Other | Attending: Obstetrics and Gynecology | Admitting: Obstetrics and Gynecology

## 2023-03-25 DIAGNOSIS — Z6841 Body Mass Index (BMI) 40.0 and over, adult: Secondary | ICD-10-CM | POA: Diagnosis not present

## 2023-03-25 DIAGNOSIS — D573 Sickle-cell trait: Secondary | ICD-10-CM | POA: Diagnosis not present

## 2023-03-25 DIAGNOSIS — Z79899 Other long term (current) drug therapy: Secondary | ICD-10-CM | POA: Insufficient documentation

## 2023-03-25 DIAGNOSIS — N920 Excessive and frequent menstruation with regular cycle: Secondary | ICD-10-CM | POA: Diagnosis not present

## 2023-03-25 DIAGNOSIS — F411 Generalized anxiety disorder: Secondary | ICD-10-CM | POA: Diagnosis not present

## 2023-03-25 DIAGNOSIS — Z01818 Encounter for other preprocedural examination: Secondary | ICD-10-CM

## 2023-03-25 DIAGNOSIS — N840 Polyp of corpus uteri: Secondary | ICD-10-CM | POA: Diagnosis not present

## 2023-03-25 DIAGNOSIS — F419 Anxiety disorder, unspecified: Secondary | ICD-10-CM | POA: Diagnosis not present

## 2023-03-25 DIAGNOSIS — N938 Other specified abnormal uterine and vaginal bleeding: Secondary | ICD-10-CM | POA: Insufficient documentation

## 2023-03-25 HISTORY — DX: Iron deficiency anemia, unspecified: D50.9

## 2023-03-25 HISTORY — DX: Presence of spectacles and contact lenses: Z97.3

## 2023-03-25 HISTORY — DX: Polyp of corpus uteri: N84.0

## 2023-03-25 HISTORY — PX: DILATATION & CURETTAGE/HYSTEROSCOPY WITH MYOSURE: SHX6511

## 2023-03-25 HISTORY — DX: Personal history of other mental and behavioral disorders: Z86.59

## 2023-03-25 HISTORY — DX: Generalized anxiety disorder: F41.1

## 2023-03-25 HISTORY — DX: Excessive and frequent menstruation with regular cycle: N92.0

## 2023-03-25 HISTORY — DX: Sickle-cell trait: D57.3

## 2023-03-25 LAB — POCT PREGNANCY, URINE: Preg Test, Ur: NEGATIVE

## 2023-03-25 LAB — CBC
HCT: 35.4 % — ABNORMAL LOW (ref 36.0–46.0)
Hemoglobin: 11.3 g/dL — ABNORMAL LOW (ref 12.0–15.0)
MCH: 25.6 pg — ABNORMAL LOW (ref 26.0–34.0)
MCHC: 31.9 g/dL (ref 30.0–36.0)
MCV: 80.3 fL (ref 80.0–100.0)
Platelets: 272 10*3/uL (ref 150–400)
RBC: 4.41 MIL/uL (ref 3.87–5.11)
RDW: 16 % — ABNORMAL HIGH (ref 11.5–15.5)
WBC: 5.1 10*3/uL (ref 4.0–10.5)
nRBC: 0 % (ref 0.0–0.2)

## 2023-03-25 SURGERY — DILATATION & CURETTAGE/HYSTEROSCOPY WITH MYOSURE
Anesthesia: General

## 2023-03-25 MED ORDER — PROPOFOL 10 MG/ML IV BOLUS
INTRAVENOUS | Status: DC | PRN
  Administered 2023-03-25: 200 mg via INTRAVENOUS

## 2023-03-25 MED ORDER — SODIUM CHLORIDE 0.9 % IR SOLN
Status: DC | PRN
  Administered 2023-03-25: 3000 mL

## 2023-03-25 MED ORDER — LIDOCAINE HCL (CARDIAC) PF 100 MG/5ML IV SOSY
PREFILLED_SYRINGE | INTRAVENOUS | Status: DC | PRN
  Administered 2023-03-25: 50 mg via INTRAVENOUS

## 2023-03-25 MED ORDER — MIDAZOLAM HCL 2 MG/2ML IJ SOLN
INTRAMUSCULAR | Status: AC
  Filled 2023-03-25: qty 2

## 2023-03-25 MED ORDER — ONDANSETRON HCL 4 MG/2ML IJ SOLN
INTRAMUSCULAR | Status: DC | PRN
  Administered 2023-03-25: 4 mg via INTRAVENOUS

## 2023-03-25 MED ORDER — PROPOFOL 10 MG/ML IV BOLUS
INTRAVENOUS | Status: AC
  Filled 2023-03-25: qty 20

## 2023-03-25 MED ORDER — FENTANYL CITRATE (PF) 100 MCG/2ML IJ SOLN
INTRAMUSCULAR | Status: DC | PRN
  Administered 2023-03-25: 25 ug via INTRAVENOUS
  Administered 2023-03-25: 50 ug via INTRAVENOUS
  Administered 2023-03-25: 25 ug via INTRAVENOUS

## 2023-03-25 MED ORDER — LACTATED RINGERS IV SOLN: INTRAVENOUS | Status: DC

## 2023-03-25 MED ORDER — KETOROLAC TROMETHAMINE 30 MG/ML IJ SOLN
INTRAMUSCULAR | Status: DC | PRN
  Administered 2023-03-25: 30 mg via INTRAVENOUS

## 2023-03-25 MED ORDER — FENTANYL CITRATE (PF) 100 MCG/2ML IJ SOLN: 25.0000 ug | INTRAMUSCULAR | Status: DC | PRN

## 2023-03-25 MED ORDER — FENTANYL CITRATE (PF) 100 MCG/2ML IJ SOLN
INTRAMUSCULAR | Status: AC
  Filled 2023-03-25: qty 2

## 2023-03-25 MED ORDER — DEXAMETHASONE SODIUM PHOSPHATE 4 MG/ML IJ SOLN
INTRAMUSCULAR | Status: DC | PRN
  Administered 2023-03-25: 8 mg via INTRAVENOUS

## 2023-03-25 MED ORDER — IBUPROFEN 800 MG PO TABS: 800.0000 mg | ORAL_TABLET | Freq: Three times a day (TID) | ORAL | 11 refills | Status: AC | PRN

## 2023-03-25 MED ORDER — ACETAMINOPHEN 500 MG PO TABS
1000.0000 mg | ORAL_TABLET | Freq: Once | ORAL | Status: AC
  Administered 2023-03-25: 1000 mg via ORAL

## 2023-03-25 MED ORDER — PHENYLEPHRINE HCL (PRESSORS) 10 MG/ML IV SOLN
INTRAVENOUS | Status: DC | PRN
  Administered 2023-03-25: 120 ug via INTRAVENOUS

## 2023-03-25 MED ORDER — ACETAMINOPHEN 500 MG PO TABS
ORAL_TABLET | ORAL | Status: AC
  Filled 2023-03-25: qty 2

## 2023-03-25 MED ORDER — POVIDONE-IODINE 10 % EX SWAB: 2.0000 | Freq: Once | CUTANEOUS | Status: DC

## 2023-03-25 MED ORDER — MIDAZOLAM HCL 2 MG/2ML IJ SOLN
INTRAMUSCULAR | Status: DC | PRN
  Administered 2023-03-25: 2 mg via INTRAVENOUS

## 2023-03-25 SURGICAL SUPPLY — 22 items
CATH ROBINSON RED A/P 16FR (CATHETERS) IMPLANT
DEVICE MYOSURE LITE (MISCELLANEOUS) IMPLANT
DEVICE MYOSURE REACH (MISCELLANEOUS) IMPLANT
DILATOR CANAL MILEX (MISCELLANEOUS) IMPLANT
DRSG TELFA 3X8 NADH STRL (GAUZE/BANDAGES/DRESSINGS) ×1 IMPLANT
GAUZE 4X4 16PLY ~~LOC~~+RFID DBL (SPONGE) ×1 IMPLANT
GLOVE BIOGEL PI IND STRL 7.0 (GLOVE) ×1 IMPLANT
GLOVE ECLIPSE 6.5 STRL STRAW (GLOVE) ×1 IMPLANT
GOWN STRL REUS W/TWL LRG LVL3 (GOWN DISPOSABLE) ×1 IMPLANT
IV NS IRRIG 3000ML ARTHROMATIC (IV SOLUTION) ×1 IMPLANT
KIT PROCEDURE FLUENT (KITS) ×1 IMPLANT
KIT TURNOVER CYSTO (KITS) ×1 IMPLANT
MYOSURE XL FIBROID (MISCELLANEOUS)
PACK VAGINAL MINOR WOMEN LF (CUSTOM PROCEDURE TRAY) ×1 IMPLANT
PAD OB MATERNITY 4.3X12.25 (PERSONAL CARE ITEMS) ×1 IMPLANT
PAD PREP 24X48 CUFFED NSTRL (MISCELLANEOUS) ×1 IMPLANT
SEAL CERVICAL OMNI LOK (ABLATOR) IMPLANT
SEAL ROD LENS SCOPE MYOSURE (ABLATOR) ×1 IMPLANT
SLEEVE SCD COMPRESS KNEE MED (STOCKING) ×1 IMPLANT
SYSTEM TISS REMOVAL MYOSURE XL (MISCELLANEOUS) IMPLANT
TOWEL OR 17X24 6PK STRL BLUE (TOWEL DISPOSABLE) ×1 IMPLANT
WATER STERILE IRR 500ML POUR (IV SOLUTION) ×1 IMPLANT

## 2023-03-25 NOTE — Anesthesia Preprocedure Evaluation (Addendum)
Anesthesia Evaluation  Patient identified by MRN, date of birth, ID band Patient awake    Reviewed: Allergy & Precautions, NPO status , Patient's Chart, lab work & pertinent test results  Airway Mallampati: III  TM Distance: >3 FB Neck ROM: Full    Dental  (+) Teeth Intact, Dental Advisory Given Upper braces:   Pulmonary neg pulmonary ROS   Pulmonary exam normal breath sounds clear to auscultation       Cardiovascular negative cardio ROS Normal cardiovascular exam Rhythm:Regular Rate:Normal     Neuro/Psych  PSYCHIATRIC DISORDERS Anxiety     negative neurological ROS     GI/Hepatic negative GI ROS, Neg liver ROS,,,  Endo/Other    Morbid obesity (BMI 46)  Renal/GU negative Renal ROS  negative genitourinary   Musculoskeletal negative musculoskeletal ROS (+)    Abdominal   Peds  Hematology  (+) Blood dyscrasia, Sickle cell trait and anemia   Anesthesia Other Findings   Reproductive/Obstetrics                             Anesthesia Physical Anesthesia Plan  ASA: 3  Anesthesia Plan: General   Post-op Pain Management: Tylenol PO (pre-op)*   Induction: Intravenous  PONV Risk Score and Plan: 3 and Ondansetron, Dexamethasone and Midazolam  Airway Management Planned: LMA  Additional Equipment:   Intra-op Plan:   Post-operative Plan: Extubation in OR  Informed Consent: I have reviewed the patients History and Physical, chart, labs and discussed the procedure including the risks, benefits and alternatives for the proposed anesthesia with the patient or authorized representative who has indicated his/her understanding and acceptance.     Dental advisory given  Plan Discussed with: CRNA  Anesthesia Plan Comments:        Anesthesia Quick Evaluation

## 2023-03-25 NOTE — H&P (Signed)
Christina Armstrong is an 47 y.o. female. F presents for surgical mgmt of AUB and endometrial polyp noted on sonogram  Pertinent Gynecological History: Menses: flow is excessive with use of 6 pads or tampons on heaviest days Bleeding: dysfunctional uterine bleeding Contraception: tubal ligation DES exposure: denies Blood transfusions: none Sexually transmitted diseases: no past history Previous GYN Procedures:  TL   Last mammogram: normal Date: 2023 Last pap: normal Date: 2024    Menstrual History: Menarche age: n/a Patient's last menstrual period was 03/24/2023 (approximate).    Past Medical History:  Diagnosis Date   Endometrial polyp    GAD (generalized anxiety disorder)    History of panic attacks    IDA (iron deficiency anemia)    hematology---- dr Truett Perna;  takes oral iron   Menorrhagia    Sickle cell trait (HCC)    Wears glasses     Past Surgical History:  Procedure Laterality Date   CESAREAN SECTION  1996   ESOPHAGOGASTRODUODENOSCOPY  2010   TUBAL LIGATION Bilateral 2000    Family History  Problem Relation Age of Onset   Other Neg Hx     Social History:  reports that she has never smoked. She has never used smokeless tobacco. She reports current alcohol use. She reports that she does not use drugs.  Allergies:  Allergies  Allergen Reactions   Ciprofloxacin Itching   Codeine Itching    Ok with benadryl    Medications Prior to Admission  Medication Sig Dispense Refill Last Dose   Cholecalciferol (VITAMIN D3) 125 MCG (5000 UT) CAPS Take by mouth 3 (three) times a week.   03/24/2023   Ferrous Gluconate-C-Folic Acid (IRON-C PO) Take 1 tablet by mouth daily.   Past Week   Multiple Vitamins-Minerals (MULTIVIT/MULTIMINERAL ADULT) LIQD Take by mouth daily.   03/24/2023   traZODone (DESYREL) 50 MG tablet Take by mouth at bedtime as needed.   More than a month   WEGOVY 0.25 MG/0.5ML SOAJ Inject into the skin. (Patient not taking: Reported on 05/14/2022)       Review of  Systems  All other systems reviewed and are negative.   Blood pressure (!) 167/90, pulse 83, temperature 98.5 F (36.9 C), temperature source Oral, resp. rate 17, height 5\' 2"  (1.575 m), weight 113.9 kg, last menstrual period 03/24/2023, SpO2 97 %. Physical Exam Constitutional:      Appearance: Normal appearance. She is obese.  Eyes:     Extraocular Movements: Extraocular movements intact.  Cardiovascular:     Rate and Rhythm: Regular rhythm.  Pulmonary:     Breath sounds: Normal breath sounds.  Abdominal:     Palpations: Abdomen is soft.  Genitourinary:    General: Normal vulva.     Comments: Vagina blood in vault Cervix parous  Uterus AV bulky Adnexa no palp mass Musculoskeletal:        General: Normal range of motion.     Cervical back: Neck supple.  Skin:    General: Skin is warm and dry.  Neurological:     Mental Status: She is alert.  Psychiatric:        Mood and Affect: Mood normal.     Results for orders placed or performed during the hospital encounter of 03/25/23 (from the past 24 hour(s))  Pregnancy, urine POC     Status: None   Collection Time: 03/25/23 11:15 AM  Result Value Ref Range   Preg Test, Ur NEGATIVE NEGATIVE  CBC     Status: Abnormal  Collection Time: 03/25/23 11:41 AM  Result Value Ref Range   WBC 5.1 4.0 - 10.5 K/uL   RBC 4.41 3.87 - 5.11 MIL/uL   Hemoglobin 11.3 (L) 12.0 - 15.0 g/dL   HCT 16.1 (L) 09.6 - 04.5 %   MCV 80.3 80.0 - 100.0 fL   MCH 25.6 (L) 26.0 - 34.0 pg   MCHC 31.9 30.0 - 36.0 g/dL   RDW 40.9 (H) 81.1 - 91.4 %   Platelets 272 150 - 400 K/uL   nRBC 0.0 0.0 - 0.2 %    No results found.  Assessment/Plan: Menorrhagia/AUB Hx TL P) dx hysteroscopy, hysteroscopic resection of endometrial polyp D&C. Procedure explained. Risk of surgery reviewed including infection, bleeding, injury to surrounding organ structures, fluid overload and its mgmt, thermal injury, uterine perforation( 11/998) and its risk. All ?  answered Jaycee Mckellips A Tiernan Suto 03/25/2023, 12:16 PM

## 2023-03-25 NOTE — Transfer of Care (Signed)
Immediate Anesthesia Transfer of Care Note  Patient: Christina Armstrong  Procedure(s) Performed: DILATATION & CURETTAGE/HYSTEROSCOPY WITH MYOSURE  Patient Location: PACU  Anesthesia Type:General  Level of Consciousness: awake and patient cooperative  Airway & Oxygen Therapy: Patient Spontanous Breathing and Patient connected to nasal cannula oxygen  Post-op Assessment: Report given to RN and Post -op Vital signs reviewed and stable  Post vital signs: Reviewed and stable  Last Vitals:  Vitals Value Taken Time  BP 125/77 03/25/23 1315  Temp    Pulse 64 03/25/23 1320  Resp 18 03/25/23 1320  SpO2 100 % 03/25/23 1320  Vitals shown include unvalidated device data.  Last Pain:  Vitals:   03/25/23 1154  TempSrc: Oral  PainSc: 0-No pain      Patients Stated Pain Goal: 5 (03/25/23 1154)  Complications: No notable events documented.

## 2023-03-25 NOTE — Anesthesia Procedure Notes (Signed)
Procedure Name: LMA Insertion Date/Time: 03/25/2023 12:36 PM  Performed by: Earmon Phoenix, CRNAPre-anesthesia Checklist: Patient identified, Emergency Drugs available, Suction available, Patient being monitored and Timeout performed Patient Re-evaluated:Patient Re-evaluated prior to induction Oxygen Delivery Method: Circle system utilized Preoxygenation: Pre-oxygenation with 100% oxygen Induction Type: IV induction Ventilation: Mask ventilation without difficulty LMA: LMA inserted LMA Size: 4.0 Number of attempts: 1 Placement Confirmation: positive ETCO2 and breath sounds checked- equal and bilateral Tube secured with: Tape Dental Injury: Teeth and Oropharynx as per pre-operative assessment

## 2023-03-25 NOTE — Discharge Instructions (Addendum)
No acetaminophen/Tylenol until after 5:30pm today if needed for pain.   No ibuprofen, Advil, Aleve, Motrin, ketorolac, meloxicam, naproxen, or other NSAIDS until after 6:45pm today if needed for pain.   DISCHARGE INSTRUCTIONS: HYSTEROSCOPY / ENDOMETRIAL ABLATION The following instructions have been prepared to help you care for yourself upon your return home.   Personal hygiene:  Use sanitary pads for vaginal drainage, not tampons.  Shower the day after your procedure.  NO tub baths, pools or Jacuzzis for 2-3 weeks.  Wipe front to back after using the bathroom.  Activity and limitations:  Do NOT drive or operate any equipment for 24 hours. The effects of anesthesia are still present and drowsiness may result.  Do NOT rest in bed all day.  Walking is encouraged.  Walk up and down stairs slowly.  You may resume your normal activity in one to two days or as indicated by your physician. Sexual activity: NO intercourse for at least 2 weeks after the procedure, or as indicated by your Doctor.  Diet: Eat a light meal as desired this evening. You may resume your usual diet tomorrow.  Return to Work: You may resume your work activities in one to two days or as indicated by Therapist, sports.  What to expect after your surgery: Expect to have vaginal bleeding/discharge for 2-3 days and spotting for up to 10 days. It is not unusual to have soreness for up to 1-2 weeks. You may have a slight burning sensation when you urinate for the first day. Mild cramps may continue for a couple of days. You may have a regular period in 2-6 weeks.  Call your doctor for any of the following:  Excessive vaginal bleeding or clotting, saturating and changing one pad every hour.  Inability to urinate 6 hours after discharge from hospital.  Pain not relieved by pain medication.  Fever of 100.4 F or greater.  Unusual vaginal discharge or odor.     Post Anesthesia Home Care Instructions  Activity: Get  plenty of rest for the remainder of the day. A responsible individual must stay with you for 24 hours following the procedure.  For the next 24 hours, DO NOT: -Drive a car -Advertising copywriter -Drink alcoholic beverages -Take any medication unless instructed by your physician -Make any legal decisions or sign important papers.  Meals: Start with liquid foods such as gelatin or soup. Progress to regular foods as tolerated. Avoid greasy, spicy, heavy foods. If nausea and/or vomiting occur, drink only clear liquids until the nausea and/or vomiting subsides. Call your physician if vomiting continues.  Special Instructions/Symptoms: Your throat may feel dry or sore from the anesthesia or the breathing tube placed in your throat during surgery. If this causes discomfort, gargle with warm salt water. The discomfort should disappear within 24 hours.

## 2023-03-25 NOTE — Brief Op Note (Signed)
03/25/2023  1:18 PM  PATIENT:  Christina Armstrong  47 y.o. female  PRE-OPERATIVE DIAGNOSIS:  Endometrial Polyp  POST-OPERATIVE DIAGNOSIS:  Endometrial Polyp  PROCEDURE:  Procedure(s): DILATATION & CURETTAGE/HYSTEROSCOPY WITH MYOSURE (N/A)  SURGEON:  Surgeon(s) and Role:    * Maxie Better, MD - Primary  PHYSICIAN ASSISTANT:   ASSISTANTS: {ASSISTANTS:31801}   ANESTHESIA:   {procedures; anesthesia:812}  EBL:  {None/Minimal: 21241}   BLOOD ADMINISTERED:{BLOOD GIVEN TYPES AND AMOUNTS:20467}  DRAINS: {Devices; drains:31758}   LOCAL MEDICATIONS USED:  {LOCAL MEDICATIONS:10721995}  SPECIMEN:  {ONC STAGING; AJCC TYPE OF SPECIMEN:115600001}  DISPOSITION OF SPECIMEN:  {SPECIMEN DISPOSITION:204680}  COUNTS:  {OR COUNTS CORRECT/INCORRECT:204690}  TOURNIQUET:  * No tourniquets in log *  DICTATION: .{DICTATION TYPES:[phone]}  PLAN OF CARE: {OPTIME PLAN OF UJWJ:1914782956}  PATIENT DISPOSITION:  {op note disposition:31782}   Delay start of Pharmacological VTE agent (>24hrs) due to surgical blood loss or risk of bleeding: {YES/NO/NOT APPLICABLE:20182}

## 2023-03-26 NOTE — Anesthesia Postprocedure Evaluation (Signed)
Anesthesia Post Note  Patient: Christina Armstrong  Procedure(s) Performed: DILATATION & CURETTAGE/HYSTEROSCOPY WITH MYOSURE     Patient location during evaluation: PACU Anesthesia Type: General Level of consciousness: awake and alert Pain management: pain level controlled Vital Signs Assessment: post-procedure vital signs reviewed and stable Respiratory status: spontaneous breathing, nonlabored ventilation, respiratory function stable and patient connected to nasal cannula oxygen Cardiovascular status: blood pressure returned to baseline and stable Postop Assessment: no apparent nausea or vomiting Anesthetic complications: no  No notable events documented.  Last Vitals:  Vitals:   03/25/23 1400 03/25/23 1410  BP: 132/85 (!) 144/83  Pulse:  71  Resp:  14  Temp:  36.6 C  SpO2:  100%    Last Pain:  Vitals:   03/25/23 1410  TempSrc:   PainSc: 0-No pain                 [name] L [name]

## 2023-03-26 NOTE — Op Note (Unsigned)
NAME: Christina Armstrong, [place] MEDICAL RECORD NO: [id number] ACCOUNT NO: [id number] DATE OF BIRTH: [birth year] FACILITY: [facility] LOCATION: [facility] PHYSICIAN: Elma Shands A. Cherly Hensen, MD  Operative Report   DATE OF PROCEDURE: 03/25/2023  PREOPERATIVE DIAGNOSES:  Menorrhagia, endometrial polyp.  PROCEDURE:  Diagnostic hysteroscopy, hysteroscopic resection of endometrial polyp, dilation and curettage.  POSTOPERATIVE DIAGNOSES:  Menorrhagia, endometrial polyp.  ANESTHESIA:  General.  SURGEON:  Jabreel Chimento A. Cherly Hensen, MD.  ASSISTANT:  None.  DESCRIPTION OF PROCEDURE:  Under adequate general anesthesia, the patient was placed in the dorsal lithotomy position.  She was sterilely prepped and draped in the usual fashion.  The patient had voided prior to entering the room and therefore she was  not catheterized.  Examination under anesthesia revealed the anteverted uterus, no adnexal masses could be appreciated.  Bivalve speculum was placed in the vagina.  A single-tooth tenaculum was placed on the anterior lip of the cervix.  Cervix easily  accepted a #19 Pratt dilator.  Diagnostic hysteroscope was introduced into the uterine cavity.  The patient was having ongoing vaginal bleeding and therefore there was polypoid lesion as well as clots noted in the cavity.  Both tubal ostia could be seen.   Using the Reach resectoscope the entire endometrium as well as the endometrial polyps were resected and removed. The endocervical canal was inspected and no lesions were noted.  When all areas were felt have been resected, all instruments were then  removed from the vagina.  Specimen labeled endometrial curettings with polyp was sent to pathology.  ESTIMATED BLOOD LOSS:  10 mL.  COMPLICATIONS:  None.  FLUID DEFICIT:  500 mL.  DISPOSITION:  The patient tolerated the procedure well and was transferred to recovery room in stable condition.   PUS D: 03/26/2023 6:10:56 am T: 03/26/2023 9:58:00 am  JOB: [id number]/  [id number]

## 2023-03-28 LAB — SURGICAL PATHOLOGY

## 2023-03-30 ENCOUNTER — Encounter (HOSPITAL_BASED_OUTPATIENT_CLINIC_OR_DEPARTMENT_OTHER): Payer: Self-pay | Admitting: Obstetrics and Gynecology

## 2023-04-29 ENCOUNTER — Ambulatory Visit (INDEPENDENT_AMBULATORY_CARE_PROVIDER_SITE_OTHER): Payer: 59 | Admitting: Podiatry

## 2023-04-29 DIAGNOSIS — B351 Tinea unguium: Secondary | ICD-10-CM

## 2023-04-29 DIAGNOSIS — B353 Tinea pedis: Secondary | ICD-10-CM

## 2023-05-01 ENCOUNTER — Encounter: Payer: Self-pay | Admitting: Podiatry

## 2023-05-01 DIAGNOSIS — Z862 Personal history of diseases of the blood and blood-forming organs and certain disorders involving the immune mechanism: Secondary | ICD-10-CM | POA: Insufficient documentation

## 2023-05-01 DIAGNOSIS — E559 Vitamin D deficiency, unspecified: Secondary | ICD-10-CM | POA: Insufficient documentation

## 2023-05-01 NOTE — Progress Notes (Signed)
       Chief Complaint  Patient presents with   Tinea Pedis    Skin peeling and itchy. Has tried OTC cream with no relief.    Nail Problem    Nail fungus to 4th and 5th toe on right foot. No past treatment.     HPI: 47 y.o. female presenting today with concern of peeling, itchy skin bilateral as well as thickened discolored toenails that are uncomfortable in shoe gear.  She is looking to treat this in hopes of complete resolution.  Past Medical History:  Diagnosis Date   Endometrial polyp    GAD (generalized anxiety disorder)    History of panic attacks    IDA (iron deficiency anemia)    hematology---- dr Truett Perna;  takes oral iron   Menorrhagia    Sickle cell trait (HCC)    Wears glasses     Past Surgical History:  Procedure Laterality Date   CESAREAN SECTION  1996   DILATATION & CURETTAGE/HYSTEROSCOPY WITH MYOSURE N/A 03/25/2023   Procedure: DILATATION & CURETTAGE/HYSTEROSCOPY WITH MYOSURE;  Surgeon: Maxie Better, MD;  Location: [facility];  Service: Gynecology;  Laterality: N/A;   ESOPHAGOGASTRODUODENOSCOPY  2010   TUBAL LIGATION Bilateral 2000    Allergies  Allergen Reactions   Ciprofloxacin Itching   Codeine Itching    Ok with benadryl      Physical Exam:  General: The patient is alert and oriented x3 in no acute distress.  Dermatology: Skin is warm, dry and supple bilateral lower extremities.  There is dry skin and peeling along the plantar aspect of both feet.  No vesicle formation is noted.  The nails are 3 mm thick with yellow discoloration subungual debris and distal onycholysis consistent with onychomycosis.  Vascular: Palpable pedal pulses bilaterally. Capillary refill within normal limits.  No appreciable edema.  No erythema or calor.  Neurological: Light touch sensation grossly intact bilateral feet.    Assessment/Plan of Care: 1. Onychomycosis   2. Tinea pedis of both feet    Discussed clinical findings with patient  today. Due to the patient having both skin and nail fungal elements noted, she could either use 2 different topicals, 1 for the skin and 1 for the nails.  Her other option is to take 1 pill a day for 90 days which will address both issues and have a higher success rate.  Discussed risks associated with taking oral terbinafine.  Patient would like to proceed with the oral route.  Informed the patient that we will need to get a hepatic function panel first to assess of her liver enzymes.  If she has results within normal limits then we will go ahead and prescribe the oral terbinafine.  Will need to follow-up in approximately 3 to 4 months after initiation of the oral terbinafine therapy.   Clerance Lav, DPM, FACFAS [facility]     [address] [place], [place] [address]                Office [phone]  Fax [phone]

## 2023-09-13 ENCOUNTER — Other Ambulatory Visit: Payer: Self-pay

## 2023-09-13 ENCOUNTER — Encounter (HOSPITAL_BASED_OUTPATIENT_CLINIC_OR_DEPARTMENT_OTHER): Payer: Self-pay | Admitting: *Deleted

## 2023-09-13 DIAGNOSIS — M79604 Pain in right leg: Secondary | ICD-10-CM | POA: Diagnosis present

## 2023-09-13 NOTE — ED Triage Notes (Signed)
Pt reports that she woke up around 0400 with pain in the right leg just below the posterior knee. Pt denies injury or swelling. Pt says its causing her to limp and it hurts for her to walk and drive.

## 2023-09-14 ENCOUNTER — Other Ambulatory Visit (HOSPITAL_BASED_OUTPATIENT_CLINIC_OR_DEPARTMENT_OTHER): Payer: Self-pay | Admitting: Emergency Medicine

## 2023-09-14 ENCOUNTER — Emergency Department (HOSPITAL_BASED_OUTPATIENT_CLINIC_OR_DEPARTMENT_OTHER)
Admission: EM | Admit: 2023-09-14 | Discharge: 2023-09-14 | Disposition: A | Discharge status: Home / Self Care | Payer: 59 | Attending: Emergency Medicine | Admitting: Emergency Medicine

## 2023-09-14 ENCOUNTER — Ambulatory Visit (HOSPITAL_BASED_OUTPATIENT_CLINIC_OR_DEPARTMENT_OTHER)
Admission: RE | Admit: 2023-09-14 | Discharge: 2023-09-14 | Disposition: A | Discharge status: Home / Self Care | Payer: 59 | Source: Ambulatory Visit | Attending: Emergency Medicine

## 2023-09-14 DIAGNOSIS — M79604 Pain in right leg: Secondary | ICD-10-CM | POA: Insufficient documentation

## 2023-09-14 MED ORDER — HYDROCODONE-ACETAMINOPHEN 5-325 MG PO TABS
1.0000 | ORAL_TABLET | Freq: Once | ORAL | Status: AC
  Administered 2023-09-14: 1 via ORAL
  Filled 2023-09-14: qty 1

## 2023-09-14 NOTE — ED Notes (Signed)
ED Provider at bedside. 

## 2023-09-14 NOTE — ED Provider Notes (Signed)
[facility]GENC[facility] Emergency Department Provider Note MRN:  [id number]  Arrival date & time: 09/14/23     Chief Complaint   Leg pain History of Present Illness   Christina Armstrong is a 47 y.o. year-old female with no pertinent past medical history presenting to the ED with chief complaint of leg pain.  4 AM Tuesday morning patient woke up with right leg pain.  Behind the right knee.  Not going away, getting worse, causing her to limp.  No recent trauma, no recent new activities, no chest pain or shortness of breath.  Review of Systems  A thorough review of systems was obtained and all systems are negative except as noted in the HPI and PMH.   Patient's Health History    Past Medical History:  Diagnosis Date   Endometrial polyp    GAD (generalized anxiety disorder)    History of panic attacks    IDA (iron deficiency anemia)    hematology---- dr Truett Perna;  takes oral iron   Menorrhagia    Sickle cell trait (HCC)    Wears glasses     Past Surgical History:  Procedure Laterality Date   CESAREAN SECTION  1996   DILATATION & CURETTAGE/HYSTEROSCOPY WITH MYOSURE N/A 03/25/2023   Procedure: DILATATION & CURETTAGE/HYSTEROSCOPY WITH MYOSURE;  Surgeon: Maxie Better, MD;  Location: [facility];  Service: Gynecology;  Laterality: N/A;   ESOPHAGOGASTRODUODENOSCOPY  2010   TUBAL LIGATION Bilateral 2000    Family History  Problem Relation Age of Onset   Other Neg Hx     Social History   Socioeconomic History   Marital status: Married    Spouse name: Not on file   Number of children: Not on file   Years of education: Not on file   Highest education level: Not on file  Occupational History   Not on file  Tobacco Use   Smoking status: Never   Smokeless tobacco: Never  Vaping Use   Vaping status: Never Used  Substance and Sexual Activity   Alcohol use: Yes    Comment: occasional   Drug use: Never   Sexual activity: Yes    Birth  control/protection: Surgical  Other Topics Concern   Not on file  Social History Narrative   Not on file   Social Determinants of Health   Financial Resource Strain: Not on file  Food Insecurity: Not on file  Transportation Needs: Not on file  Physical Activity: Not on file  Stress: Not on file  Social Connections: Not on file  Intimate Partner Violence: Not on file     Physical Exam   Vitals:   09/13/23 2333  BP: (!) 158/118  Pulse: 70  Resp: 16  Temp: 98.7 F (37.1 C)  SpO2: 100%    CONSTITUTIONAL: Well-appearing, NAD NEURO/PSYCH:  Alert and oriented x 3, no focal deficits EYES:  eyes equal and reactive ENT/NECK:  no LAD, no JVD CARDIO: Regular rate, well-perfused, normal S1 and S2 PULM:  CTAB no wheezing or rhonchi GI/GU:  non-distended, non-tender MSK/SPINE:  No gross deformities, no edema SKIN:  no rash, atraumatic   *Additional and/or pertinent findings included in MDM below  Diagnostic and Interventional Summary    EKG Interpretation Date/Time:    Ventricular Rate:    PR Interval:    QRS Duration:    QT Interval:    QTC Calculation:   R Axis:      Text Interpretation:  Labs Reviewed - No data to display  US Venous Img Lower Unilateral Right    (Results Pending)    Medications  HYDROcodone-acetaminophen (NORCO/VICODIN) 5-325 MG per tablet 1 tablet (has no administration in time range)     Procedures  /  Critical Care Procedures  ED Course and Medical Decision Making  Initial Impression and Ddx Exam is overall reassuring, the legs are symmetric and appear normal.  There is some pain to the right popliteal region with dorsiflexion.  Neurovascularly intact, no erythema or increased warmth or signs of infection, normal range of motion of all joints.  DVT is a consideration but felt to be low risk.  Favoring MSK versus Baker's cyst.  Past medical/surgical history that increases complexity of ED encounter: None  Interpretation of  Diagnostics Laboratory and/or imaging options to aid in the diagnosis/care of the patient were considered.  After careful history and physical examination, it was determined that there was no indication for diagnostics at this time.  Patient Reassessment and Ultimate Disposition/Management     Ultrasound ordered for tomorrow morning as the technician is not here at this time of the night.  Appropriate for discharge.  Patient management required discussion with the following services or consulting groups:  None  Complexity of Problems Addressed Acute complicated illness or Injury  Additional Data Reviewed and Analyzed Further history obtained from: Further history from spouse/family member  Additional Factors Impacting ED Encounter Risk None  Elmer Sow. Pilar Plate, MD [facility] Emergency Medicine [facility] [email or web address]  Final Clinical Impressions(s) / ED Diagnoses     ICD-10-CM   1. Pain of right lower extremity  M79.604       ED Discharge Orders          Ordered    US Venous Img Lower Unilateral Right        09/14/23 0214             Discharge Instructions Discussed with and Provided to Patient:    Discharge Instructions      You were evaluated in the Emergency Department and after careful evaluation, we did not find any emergent condition requiring admission or further testing in the hospital.  Your exam/testing today was overall reassuring.  Recommend returning for ultrasound as we discussed.  Tylenol or Motrin for pain.  Please return to the Emergency Department if you experience any worsening of your condition.  Thank you for allowing [place] to be a part of your care.       Sabas Sous, MD 09/14/23 [phone]

## 2023-09-14 NOTE — ED Provider Notes (Signed)
Patient had ultrasound done which was negative for DVT.  She was called to inform of the results.  Will try conservative measures at this time and was instructed to follow-up with orthopedics if symptoms or not improving.   Gwyneth Sprout, MD 09/14/23 [phone]

## 2023-09-14 NOTE — ED Notes (Signed)
 RN reviewed discharge instructions with pt. Pt verbalized understanding and had no further questions. VSS upon discharge.  

## 2023-09-14 NOTE — Discharge Instructions (Signed)
You were evaluated in the Emergency Department and after careful evaluation, we did not find any emergent condition requiring admission or further testing in the hospital.  Your exam/testing today was overall reassuring.  Recommend returning for ultrasound as we discussed.  Tylenol or Motrin for pain.  Please return to the Emergency Department if you experience any worsening of your condition.  Thank you for allowing [place] to be a part of your care.

## 2023-10-20 ENCOUNTER — Encounter (HOSPITAL_COMMUNITY): Payer: Self-pay

## 2023-10-20 ENCOUNTER — Other Ambulatory Visit (HOSPITAL_COMMUNITY): Payer: Self-pay

## 2023-10-20 MED ORDER — WEGOVY 1 MG/0.5ML ~~LOC~~ SOAJ
1.0000 mg | SUBCUTANEOUS | 0 refills | Status: AC
  Filled 2023-10-20 – 2023-10-25 (×2): qty 2, 28d supply, fill #0

## 2023-10-25 ENCOUNTER — Other Ambulatory Visit (HOSPITAL_COMMUNITY): Payer: Self-pay
